# Patient Record
Sex: Female | Born: 1975
Health system: Southern US, Community
[De-identification: ages and names within clinical notes are randomized; demographics above are authoritative.]

---

## 2015-09-09 HISTORY — PX: APPENDECTOMY: SHX54

## 2017-02-27 DIAGNOSIS — H903 Sensorineural hearing loss, bilateral: Secondary | ICD-10-CM | POA: Diagnosis not present

## 2017-03-04 DIAGNOSIS — D2271 Melanocytic nevi of right lower limb, including hip: Secondary | ICD-10-CM | POA: Diagnosis not present

## 2017-03-04 DIAGNOSIS — L905 Scar conditions and fibrosis of skin: Secondary | ICD-10-CM | POA: Diagnosis not present

## 2017-03-20 DIAGNOSIS — Z1329 Encounter for screening for other suspected endocrine disorder: Secondary | ICD-10-CM | POA: Diagnosis not present

## 2017-03-20 DIAGNOSIS — Z114 Encounter for screening for human immunodeficiency virus [HIV]: Secondary | ICD-10-CM | POA: Diagnosis not present

## 2017-03-20 DIAGNOSIS — R079 Chest pain, unspecified: Secondary | ICD-10-CM | POA: Diagnosis not present

## 2017-03-20 DIAGNOSIS — R002 Palpitations: Secondary | ICD-10-CM | POA: Diagnosis not present

## 2017-03-20 DIAGNOSIS — Z0000 Encounter for general adult medical examination without abnormal findings: Secondary | ICD-10-CM | POA: Diagnosis not present

## 2017-03-20 DIAGNOSIS — R319 Hematuria, unspecified: Secondary | ICD-10-CM | POA: Diagnosis not present

## 2017-03-20 DIAGNOSIS — Z13220 Encounter for screening for lipoid disorders: Secondary | ICD-10-CM | POA: Diagnosis not present

## 2017-03-20 DIAGNOSIS — Z23 Encounter for immunization: Secondary | ICD-10-CM | POA: Diagnosis not present

## 2017-04-03 DIAGNOSIS — R319 Hematuria, unspecified: Secondary | ICD-10-CM | POA: Diagnosis not present

## 2017-05-01 DIAGNOSIS — M5412 Radiculopathy, cervical region: Secondary | ICD-10-CM | POA: Diagnosis not present

## 2017-05-24 DIAGNOSIS — M5412 Radiculopathy, cervical region: Secondary | ICD-10-CM | POA: Diagnosis not present

## 2017-05-29 DIAGNOSIS — M542 Cervicalgia: Secondary | ICD-10-CM | POA: Diagnosis not present

## 2017-05-29 DIAGNOSIS — G5602 Carpal tunnel syndrome, left upper limb: Secondary | ICD-10-CM | POA: Diagnosis not present

## 2017-05-29 DIAGNOSIS — M25512 Pain in left shoulder: Secondary | ICD-10-CM | POA: Diagnosis not present

## 2017-05-29 DIAGNOSIS — M50222 Other cervical disc displacement at C5-C6 level: Secondary | ICD-10-CM | POA: Diagnosis not present

## 2017-05-29 DIAGNOSIS — M5412 Radiculopathy, cervical region: Secondary | ICD-10-CM | POA: Diagnosis not present

## 2017-05-30 DIAGNOSIS — M5412 Radiculopathy, cervical region: Secondary | ICD-10-CM | POA: Diagnosis not present

## 2017-06-05 DIAGNOSIS — M50222 Other cervical disc displacement at C5-C6 level: Secondary | ICD-10-CM | POA: Diagnosis not present

## 2017-06-10 DIAGNOSIS — M50222 Other cervical disc displacement at C5-C6 level: Secondary | ICD-10-CM | POA: Diagnosis not present

## 2017-06-10 DIAGNOSIS — G5602 Carpal tunnel syndrome, left upper limb: Secondary | ICD-10-CM | POA: Diagnosis not present

## 2017-06-19 DIAGNOSIS — M5412 Radiculopathy, cervical region: Secondary | ICD-10-CM | POA: Diagnosis not present

## 2017-08-13 DIAGNOSIS — J4520 Mild intermittent asthma, uncomplicated: Secondary | ICD-10-CM | POA: Diagnosis not present

## 2017-08-13 DIAGNOSIS — R109 Unspecified abdominal pain: Secondary | ICD-10-CM | POA: Diagnosis not present

## 2017-08-13 DIAGNOSIS — G43909 Migraine, unspecified, not intractable, without status migrainosus: Secondary | ICD-10-CM | POA: Diagnosis not present

## 2017-08-13 DIAGNOSIS — I493 Ventricular premature depolarization: Secondary | ICD-10-CM | POA: Diagnosis not present

## 2017-08-21 DIAGNOSIS — R109 Unspecified abdominal pain: Secondary | ICD-10-CM | POA: Diagnosis not present

## 2017-10-02 DIAGNOSIS — B373 Candidiasis of vulva and vagina: Secondary | ICD-10-CM | POA: Diagnosis not present

## 2017-10-02 DIAGNOSIS — L292 Pruritus vulvae: Secondary | ICD-10-CM | POA: Diagnosis not present

## 2017-10-02 DIAGNOSIS — L299 Pruritus, unspecified: Secondary | ICD-10-CM | POA: Diagnosis not present

## 2017-10-07 ENCOUNTER — Other Ambulatory Visit: Payer: Self-pay | Admitting: Obstetrics & Gynecology

## 2017-10-07 DIAGNOSIS — Z1231 Encounter for screening mammogram for malignant neoplasm of breast: Secondary | ICD-10-CM

## 2017-10-29 ENCOUNTER — Encounter: Payer: Self-pay | Admitting: Radiology

## 2017-10-29 ENCOUNTER — Ambulatory Visit
Admission: RE | Admit: 2017-10-29 | Discharge: 2017-10-29 | Disposition: A | Discharge status: Home / Self Care | Payer: 59 | Source: Ambulatory Visit | Attending: Obstetrics & Gynecology | Admitting: Obstetrics & Gynecology

## 2017-10-29 ENCOUNTER — Encounter (INDEPENDENT_AMBULATORY_CARE_PROVIDER_SITE_OTHER): Payer: Self-pay

## 2017-10-29 DIAGNOSIS — Z1231 Encounter for screening mammogram for malignant neoplasm of breast: Secondary | ICD-10-CM | POA: Insufficient documentation

## 2017-10-29 IMAGING — MG DIGITAL SCREENING BILATERAL MAMMOGRAM WITH TOMO AND CAD
6 of 10 series · 6 of 26 positions shown · non-contrast
Comparison: Previous exam(s).

CLINICAL DATA: Screening.

EXAM:
DIGITAL SCREENING BILATERAL MAMMOGRAM WITH TOMO AND CAD

[R MLO synth-2D]
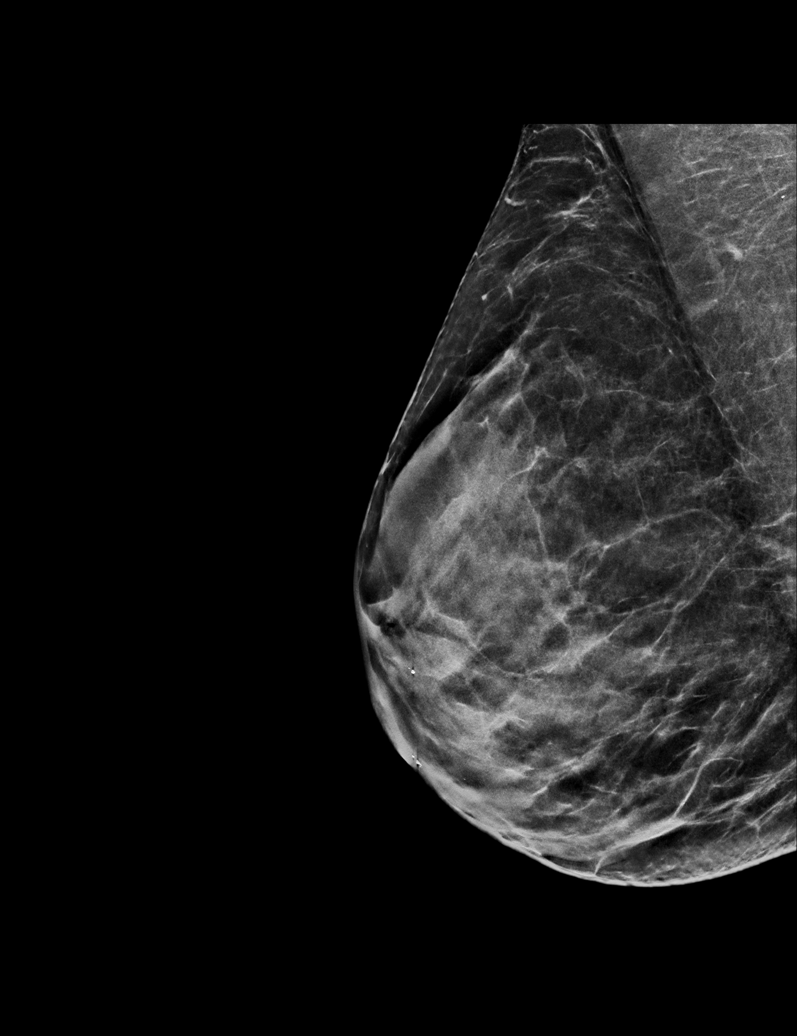

[L MLO synth-2D]
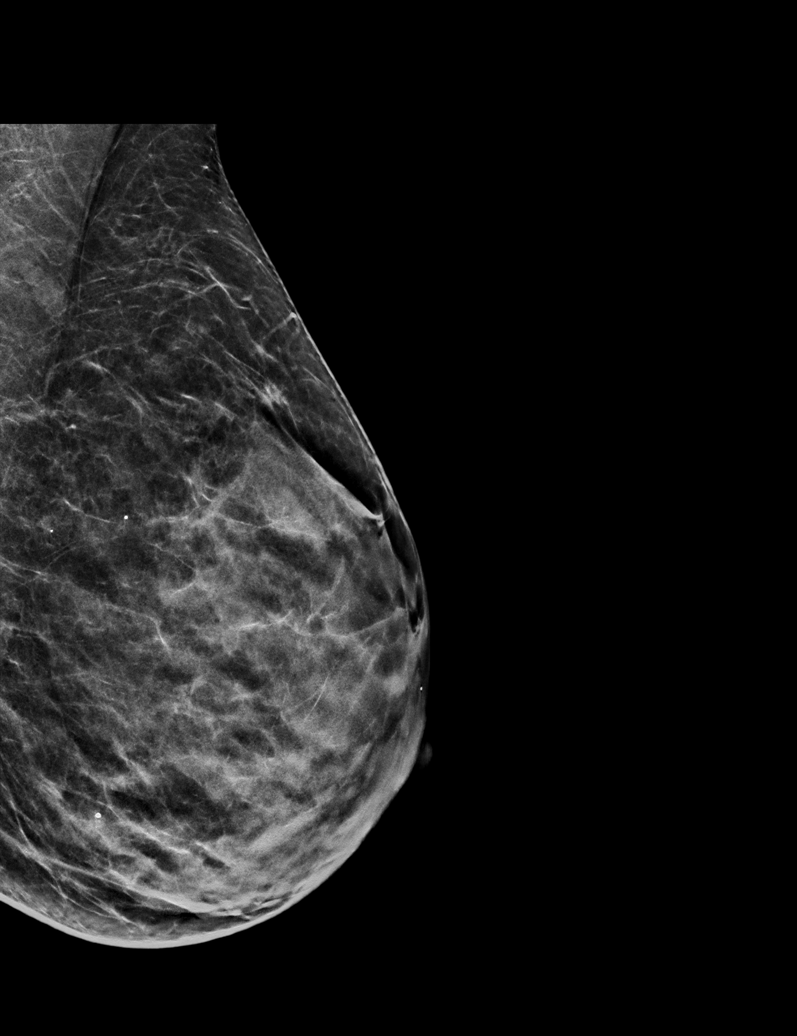

[L CC synth-2D]
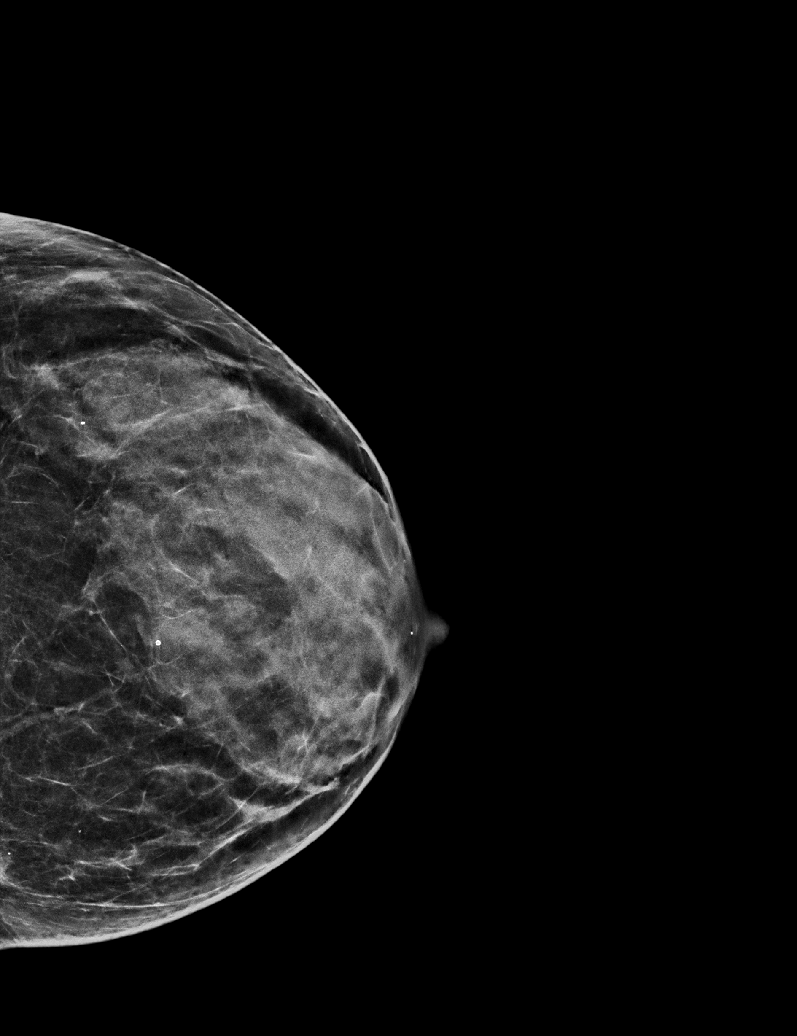

[R CC synth-2D]
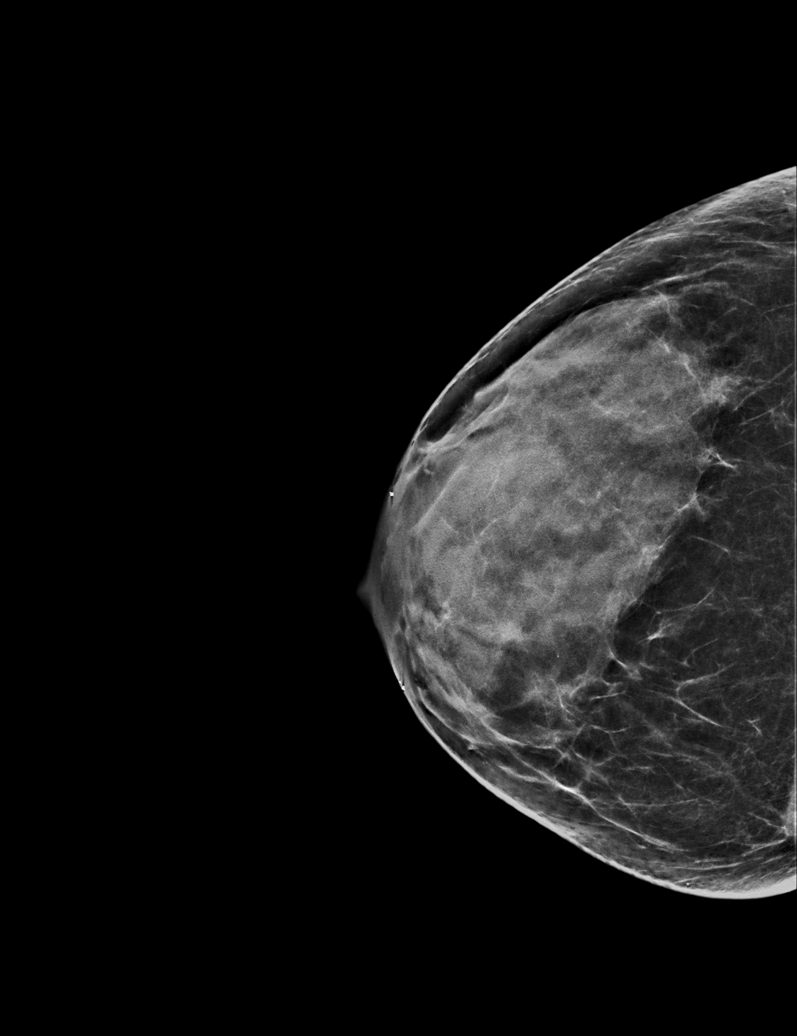

[R CC (1 of 2)]
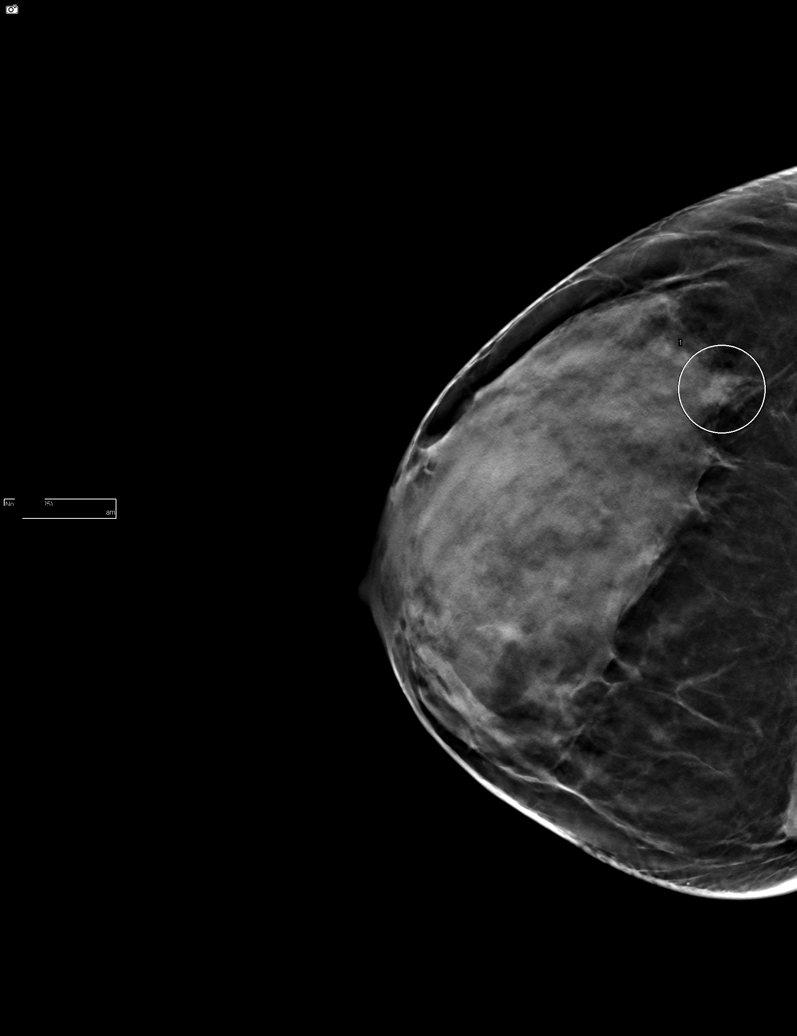

[R CC (2 of 2)]
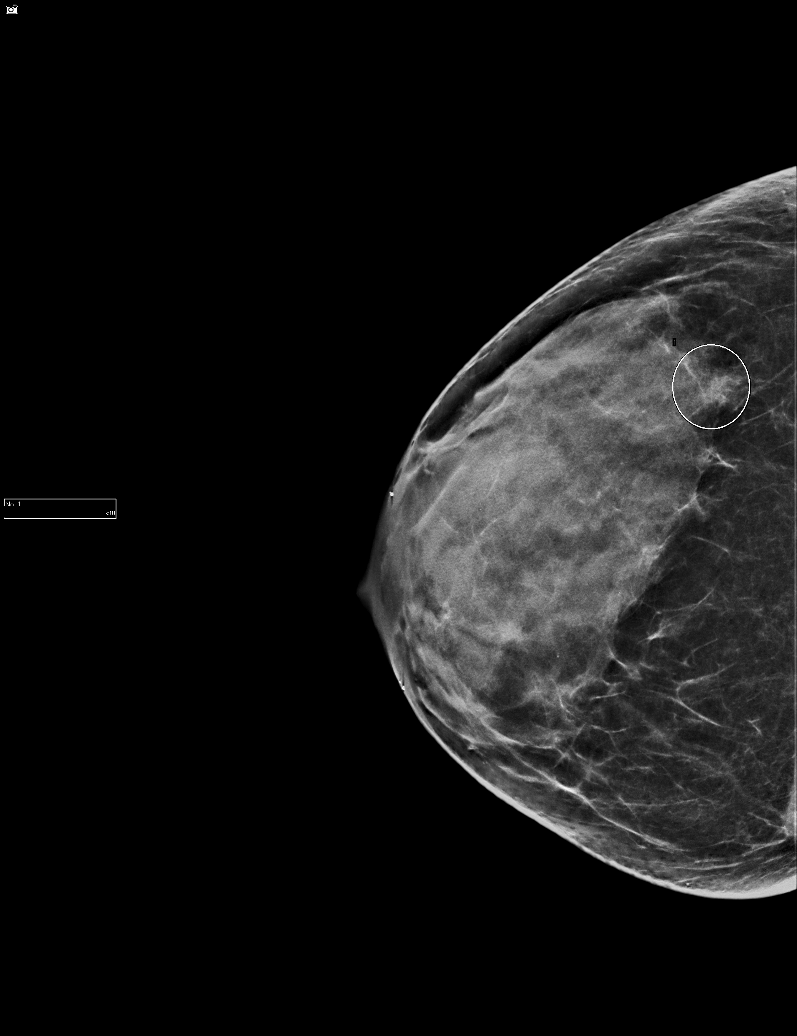

[6 of 26 positions shown; findings below may reference images not displayed]

ACR Breast Density Category d: The breast tissue is extremely dense,
which lowers the sensitivity of mammography.
FINDINGS: In the right breast, a possible asymmetry warrants further
evaluation. This possible asymmetry is seen within the outer RIGHT
breast, at posterior depth, CC view only, slice 25.

In the left breast, no findings suspicious for malignancy. Images
were processed with CAD.
IMPRESSION: Further evaluation is suggested for possible asymmetry in the right
breast.

RECOMMENDATION:
Diagnostic mammogram and possibly ultrasound of the right breast.
(Code:[A2])

The patient will be contacted regarding the findings, and additional
imaging will be scheduled.

BI-RADS CATEGORY  0: Incomplete. Need additional imaging evaluation
and/or prior mammograms for comparison.

## 2017-10-30 ENCOUNTER — Other Ambulatory Visit: Payer: Self-pay | Admitting: Obstetrics & Gynecology

## 2017-10-30 DIAGNOSIS — R928 Other abnormal and inconclusive findings on diagnostic imaging of breast: Secondary | ICD-10-CM

## 2017-10-30 DIAGNOSIS — N6489 Other specified disorders of breast: Secondary | ICD-10-CM

## 2017-11-04 ENCOUNTER — Ambulatory Visit
Admission: RE | Admit: 2017-11-04 | Discharge: 2017-11-04 | Disposition: A | Discharge status: Home / Self Care | Payer: 59 | Source: Ambulatory Visit | Attending: Obstetrics & Gynecology | Admitting: Obstetrics & Gynecology

## 2017-11-04 DIAGNOSIS — R928 Other abnormal and inconclusive findings on diagnostic imaging of breast: Secondary | ICD-10-CM | POA: Diagnosis not present

## 2017-11-04 DIAGNOSIS — N6489 Other specified disorders of breast: Secondary | ICD-10-CM | POA: Diagnosis not present

## 2017-11-04 DIAGNOSIS — R922 Inconclusive mammogram: Secondary | ICD-10-CM | POA: Diagnosis not present

## 2017-11-04 IMAGING — MG MM DIGITAL DIAGNOSTIC UNILAT*R* W/ TOMO W/ CAD
7 series · 9 of 19 positions shown · non-contrast
Comparison: Previous exam(s).

CLINICAL DATA: Right lateral breast asymmetry seen on most recent
screening mammography.

EXAM:
DIGITAL DIAGNOSTIC RIGHT MAMMOGRAM WITH CAD AND TOMO
ULTRASOUND RIGHT BREAST

[R ML synth-2D]
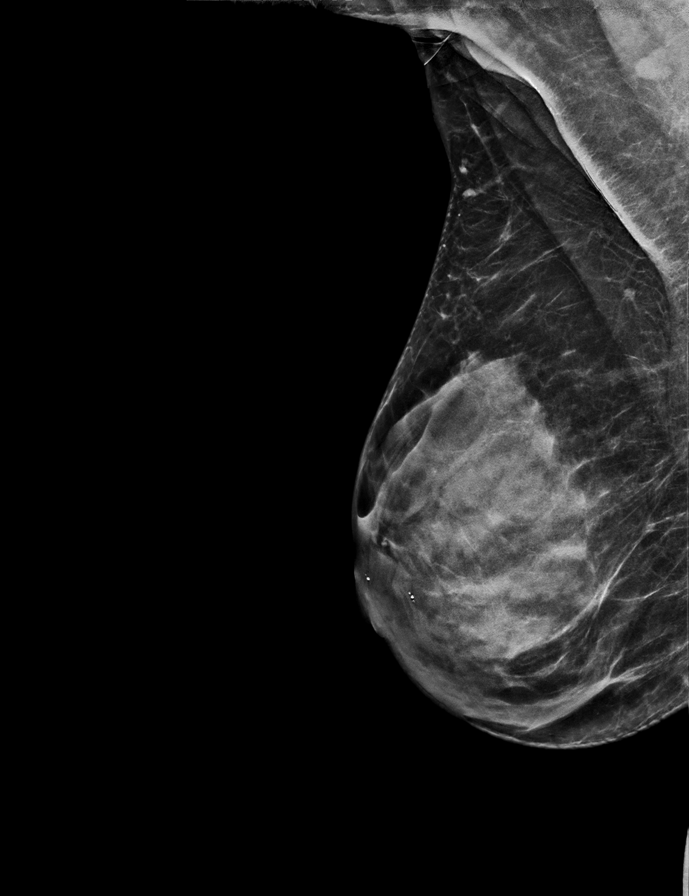

[R XCCL synth-2D]
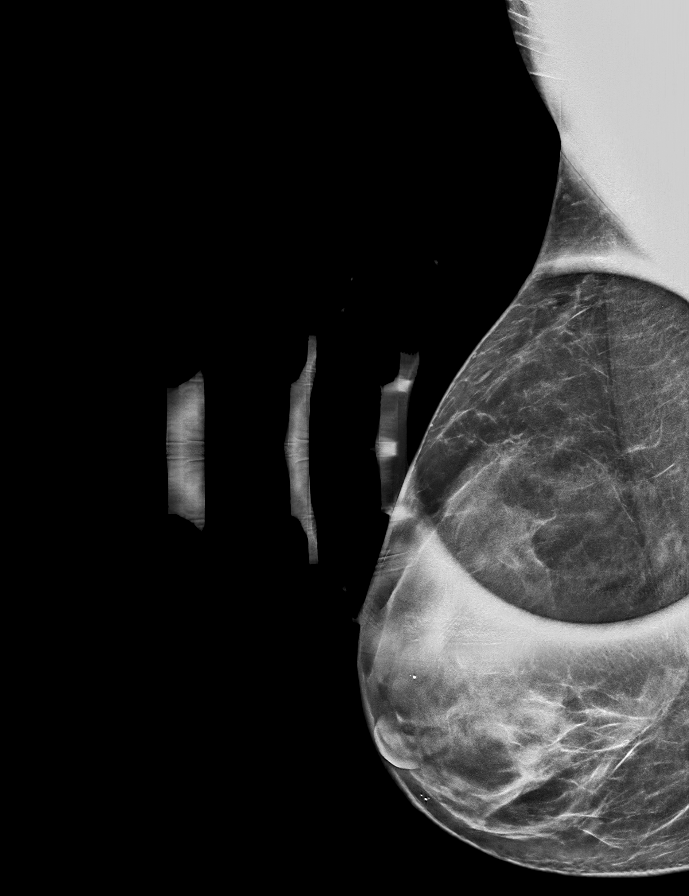

[R CC synth-2D]
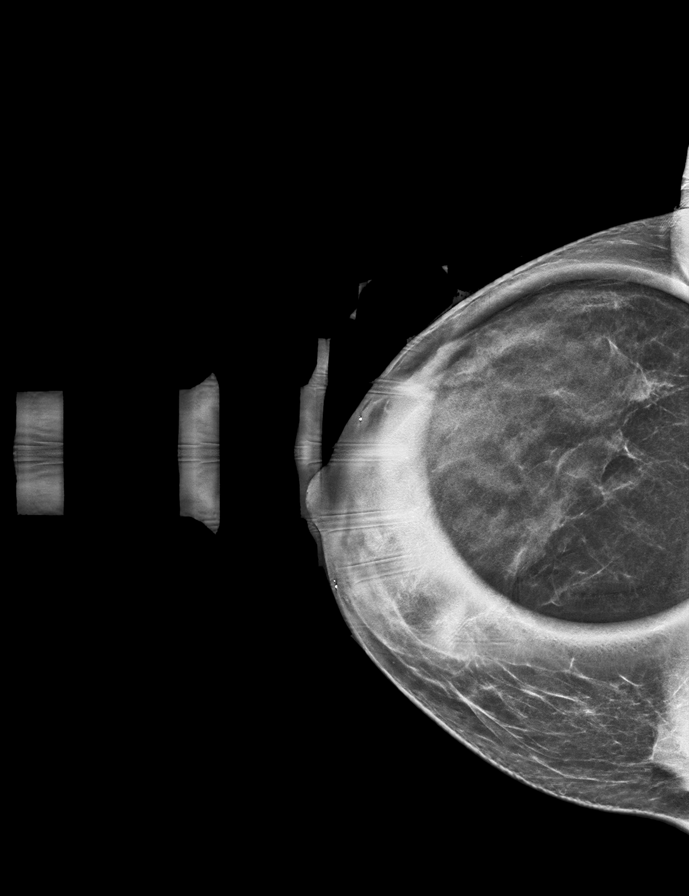

[R XCCL]
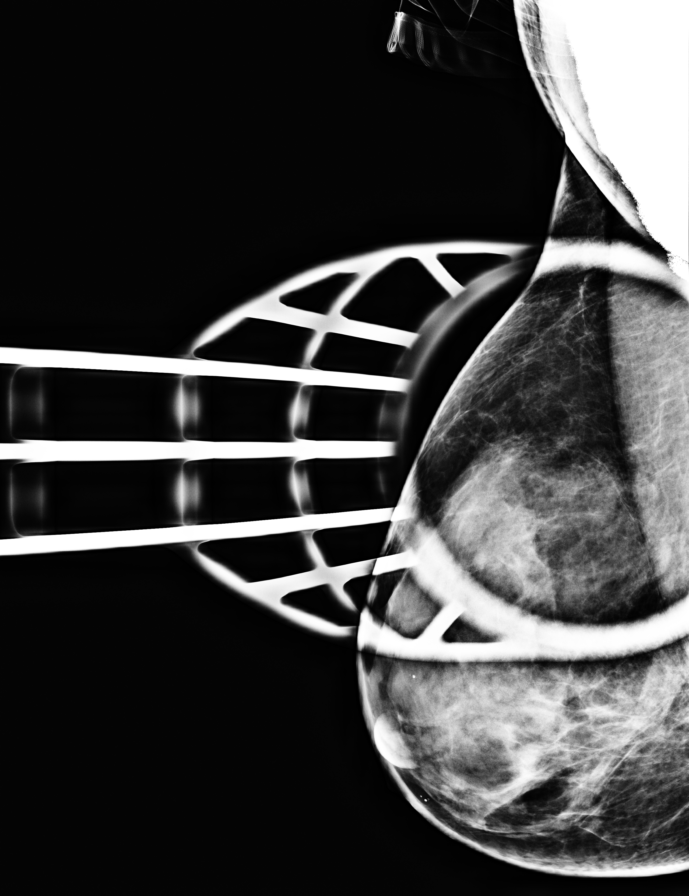

[R XCCL tomo · 3 of 60 frames shown]
[frame 20/60]
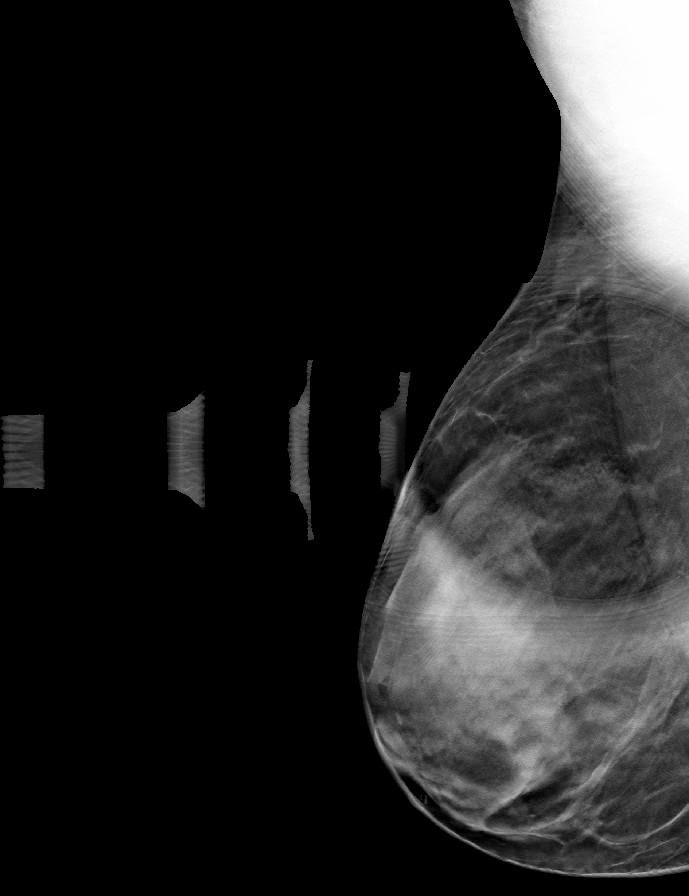
[frame 31/60]
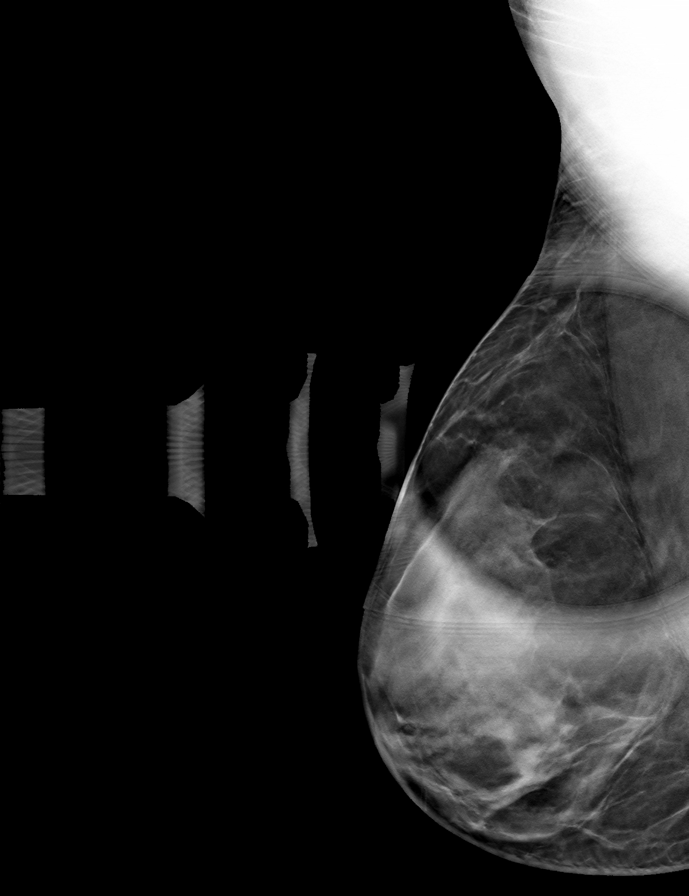
[frame 41/60]
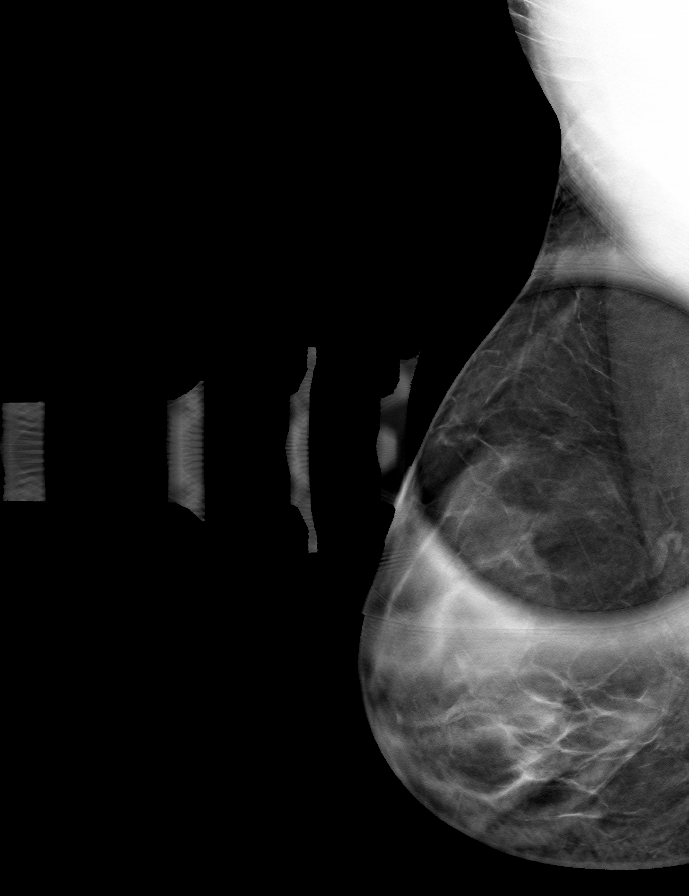

[R ML tomo · tomo slice 27/54.0]
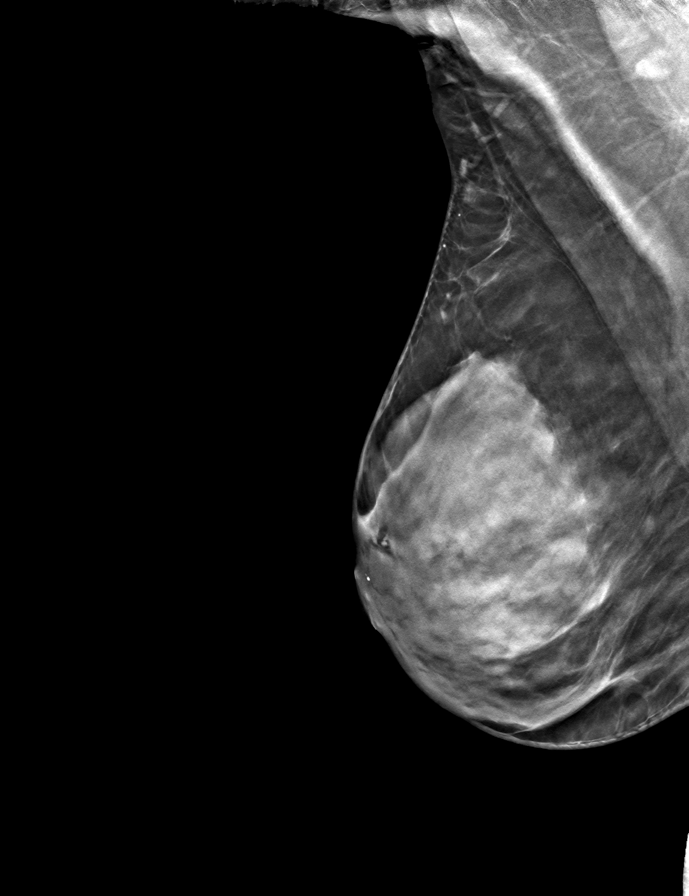

[R CC tomo · tomo slice 25/49.0]
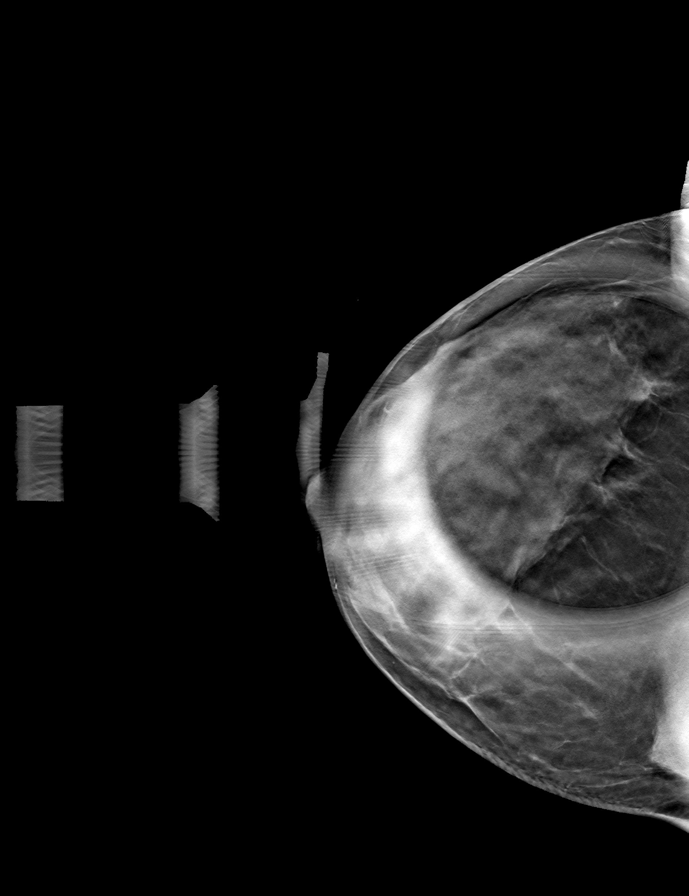

[9 of 19 positions shown; findings below may reference images not displayed]

ACR Breast Density Category d: The breast tissue is extremely dense,
which lowers the sensitivity of mammography.
FINDINGS: Additional mammographic views of the right breast demonstrate no
suspicious masses or shadowing lesions. The right lateral breast
asymmetry effaces to glandular tissue, and resumes appearance
similar to patient's oldest available mammogram, dated [DATE].

Mammographic images were processed with CAD.

On physical exam, no suspicious masses are palpated.

Targeted ultrasound is performed, showing no suspicious masses or
shadowing lesions.
IMPRESSION: No mammographic or sonographic evidence of malignancy in the right
breast.

RECOMMENDATION:
Screening mammogram in one year.(Code:[7L])

I have discussed the findings and recommendations with the patient.
Results were also provided in writing at the conclusion of the
visit. If applicable, a reminder letter will be sent to the patient
regarding the next appointment.

BI-RADS CATEGORY  1: Negative.

## 2017-11-08 ENCOUNTER — Other Ambulatory Visit: Payer: 59

## 2017-11-08 ENCOUNTER — Ambulatory Visit: Payer: 59

## 2017-11-08 DIAGNOSIS — M7062 Trochanteric bursitis, left hip: Secondary | ICD-10-CM | POA: Diagnosis not present

## 2017-11-08 DIAGNOSIS — M7061 Trochanteric bursitis, right hip: Secondary | ICD-10-CM | POA: Diagnosis not present

## 2017-11-13 DIAGNOSIS — L738 Other specified follicular disorders: Secondary | ICD-10-CM | POA: Diagnosis not present

## 2017-11-13 DIAGNOSIS — D2271 Melanocytic nevi of right lower limb, including hip: Secondary | ICD-10-CM | POA: Diagnosis not present

## 2017-11-13 DIAGNOSIS — D2272 Melanocytic nevi of left lower limb, including hip: Secondary | ICD-10-CM | POA: Diagnosis not present

## 2017-11-13 DIAGNOSIS — R5383 Other fatigue: Secondary | ICD-10-CM | POA: Diagnosis not present

## 2017-11-13 DIAGNOSIS — L659 Nonscarring hair loss, unspecified: Secondary | ICD-10-CM | POA: Diagnosis not present

## 2017-11-13 DIAGNOSIS — E559 Vitamin D deficiency, unspecified: Secondary | ICD-10-CM | POA: Diagnosis not present

## 2017-11-13 DIAGNOSIS — D225 Melanocytic nevi of trunk: Secondary | ICD-10-CM | POA: Diagnosis not present

## 2017-11-13 DIAGNOSIS — D2261 Melanocytic nevi of right upper limb, including shoulder: Secondary | ICD-10-CM | POA: Diagnosis not present

## 2017-11-13 DIAGNOSIS — L814 Other melanin hyperpigmentation: Secondary | ICD-10-CM | POA: Diagnosis not present

## 2017-11-13 DIAGNOSIS — D2262 Melanocytic nevi of left upper limb, including shoulder: Secondary | ICD-10-CM | POA: Diagnosis not present

## 2017-11-20 DIAGNOSIS — M25551 Pain in right hip: Secondary | ICD-10-CM | POA: Diagnosis not present

## 2017-11-20 DIAGNOSIS — M25552 Pain in left hip: Secondary | ICD-10-CM | POA: Diagnosis not present

## 2017-12-18 DIAGNOSIS — L659 Nonscarring hair loss, unspecified: Secondary | ICD-10-CM | POA: Diagnosis not present

## 2017-12-18 DIAGNOSIS — L650 Telogen effluvium: Secondary | ICD-10-CM | POA: Diagnosis not present

## 2017-12-18 DIAGNOSIS — E559 Vitamin D deficiency, unspecified: Secondary | ICD-10-CM | POA: Diagnosis not present

## 2017-12-18 DIAGNOSIS — S61200A Unspecified open wound of right index finger without damage to nail, initial encounter: Secondary | ICD-10-CM | POA: Diagnosis not present

## 2017-12-25 DIAGNOSIS — Z01419 Encounter for gynecological examination (general) (routine) without abnormal findings: Secondary | ICD-10-CM | POA: Diagnosis not present

## 2017-12-25 DIAGNOSIS — Z1231 Encounter for screening mammogram for malignant neoplasm of breast: Secondary | ICD-10-CM | POA: Diagnosis not present

## 2018-02-13 DIAGNOSIS — J069 Acute upper respiratory infection, unspecified: Secondary | ICD-10-CM | POA: Diagnosis not present

## 2018-02-13 DIAGNOSIS — R6889 Other general symptoms and signs: Secondary | ICD-10-CM | POA: Diagnosis not present

## 2018-02-13 DIAGNOSIS — J029 Acute pharyngitis, unspecified: Secondary | ICD-10-CM | POA: Diagnosis not present

## 2018-02-19 DIAGNOSIS — R42 Dizziness and giddiness: Secondary | ICD-10-CM | POA: Diagnosis not present

## 2018-02-25 ENCOUNTER — Telehealth: Payer: Self-pay | Admitting: Family Medicine

## 2018-02-25 MED ORDER — BUDESONIDE-FORMOTEROL FUMARATE 80-4.5 MCG/ACT IN AERO: 2.0000 | INHALATION_SPRAY | Freq: Two times a day (BID) | RESPIRATORY_TRACT | 12 refills | Status: DC

## 2018-02-25 MED ORDER — PREDNISONE 50 MG PO TABS: ORAL_TABLET | ORAL | 0 refills | Status: DC

## 2018-02-25 NOTE — Telephone Encounter (Signed)
Asthma exacerbation. Medications sent.

## 2018-03-08 DIAGNOSIS — J45909 Unspecified asthma, uncomplicated: Secondary | ICD-10-CM | POA: Diagnosis not present

## 2018-03-08 DIAGNOSIS — K0889 Other specified disorders of teeth and supporting structures: Secondary | ICD-10-CM | POA: Diagnosis not present

## 2018-03-08 DIAGNOSIS — Z79899 Other long term (current) drug therapy: Secondary | ICD-10-CM | POA: Diagnosis not present

## 2018-03-09 DIAGNOSIS — J45909 Unspecified asthma, uncomplicated: Secondary | ICD-10-CM | POA: Diagnosis not present

## 2018-03-09 DIAGNOSIS — Z9889 Other specified postprocedural states: Secondary | ICD-10-CM | POA: Diagnosis not present

## 2018-03-09 DIAGNOSIS — Z79899 Other long term (current) drug therapy: Secondary | ICD-10-CM | POA: Diagnosis not present

## 2018-03-09 DIAGNOSIS — K0889 Other specified disorders of teeth and supporting structures: Secondary | ICD-10-CM | POA: Diagnosis not present

## 2018-03-19 DIAGNOSIS — E559 Vitamin D deficiency, unspecified: Secondary | ICD-10-CM | POA: Diagnosis not present

## 2018-03-19 DIAGNOSIS — Z0000 Encounter for general adult medical examination without abnormal findings: Secondary | ICD-10-CM | POA: Diagnosis not present

## 2018-04-14 DIAGNOSIS — Z03818 Encounter for observation for suspected exposure to other biological agents ruled out: Secondary | ICD-10-CM | POA: Diagnosis not present

## 2018-04-14 DIAGNOSIS — R0602 Shortness of breath: Secondary | ICD-10-CM | POA: Diagnosis not present

## 2018-04-19 DIAGNOSIS — Z79899 Other long term (current) drug therapy: Secondary | ICD-10-CM | POA: Diagnosis not present

## 2018-04-19 DIAGNOSIS — Z975 Presence of (intrauterine) contraceptive device: Secondary | ICD-10-CM | POA: Diagnosis not present

## 2018-04-19 DIAGNOSIS — J45909 Unspecified asthma, uncomplicated: Secondary | ICD-10-CM | POA: Diagnosis not present

## 2018-04-19 DIAGNOSIS — R05 Cough: Secondary | ICD-10-CM | POA: Diagnosis not present

## 2018-04-19 DIAGNOSIS — R0602 Shortness of breath: Secondary | ICD-10-CM | POA: Diagnosis not present

## 2018-04-19 DIAGNOSIS — R0600 Dyspnea, unspecified: Secondary | ICD-10-CM | POA: Diagnosis not present

## 2018-07-08 MED FILL — SYMBICORT 80-4.5 MCG INH: 80-4.5 | 30 days supply | Qty: 10 | Fill #0

## 2018-07-15 DIAGNOSIS — R0789 Other chest pain: Secondary | ICD-10-CM | POA: Diagnosis not present

## 2018-07-15 DIAGNOSIS — F419 Anxiety disorder, unspecified: Secondary | ICD-10-CM | POA: Diagnosis not present

## 2018-08-06 DIAGNOSIS — B373 Candidiasis of vulva and vagina: Secondary | ICD-10-CM | POA: Diagnosis not present

## 2018-10-30 ENCOUNTER — Other Ambulatory Visit: Payer: Self-pay | Admitting: Obstetrics & Gynecology

## 2018-10-30 DIAGNOSIS — Z1231 Encounter for screening mammogram for malignant neoplasm of breast: Secondary | ICD-10-CM

## 2018-11-04 ENCOUNTER — Other Ambulatory Visit: Payer: Self-pay

## 2018-11-04 ENCOUNTER — Ambulatory Visit
Admission: RE | Admit: 2018-11-04 | Discharge: 2018-11-04 | Disposition: A | Discharge status: Home / Self Care | Payer: 59 | Source: Ambulatory Visit | Attending: Obstetrics & Gynecology | Admitting: Obstetrics & Gynecology

## 2018-11-04 DIAGNOSIS — Z1231 Encounter for screening mammogram for malignant neoplasm of breast: Secondary | ICD-10-CM | POA: Diagnosis not present

## 2018-11-04 IMAGING — MG DIGITAL SCREENING BILAT W/ TOMO
8 series · 9 of 24 positions shown · non-contrast
Comparison: Previous exam(s).

CLINICAL DATA: Screening.

EXAM:
DIGITAL SCREENING BILATERAL MAMMOGRAM WITH TOMO AND CAD

[R MLO synth-2D]
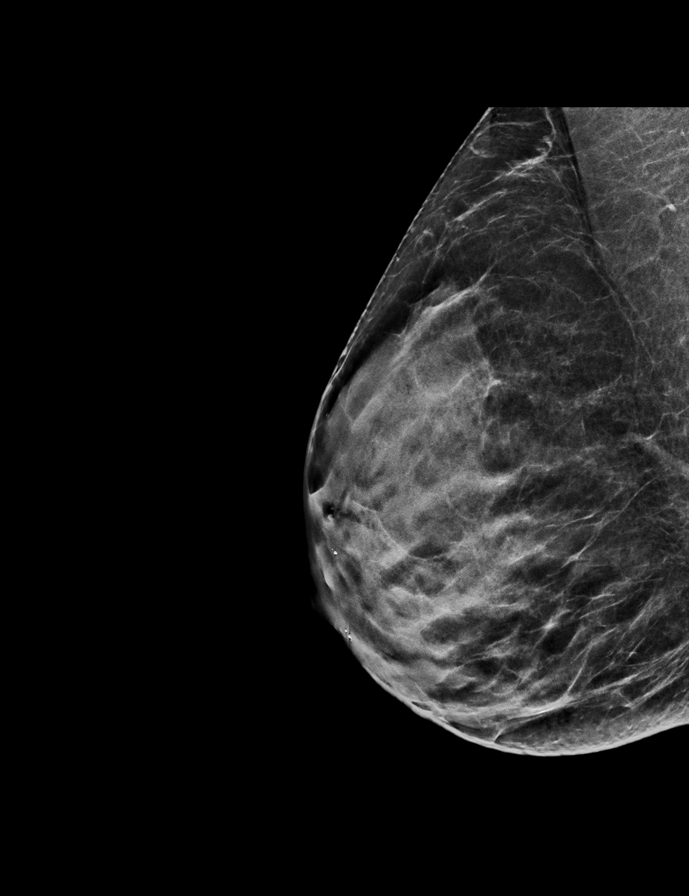

[L MLO synth-2D]
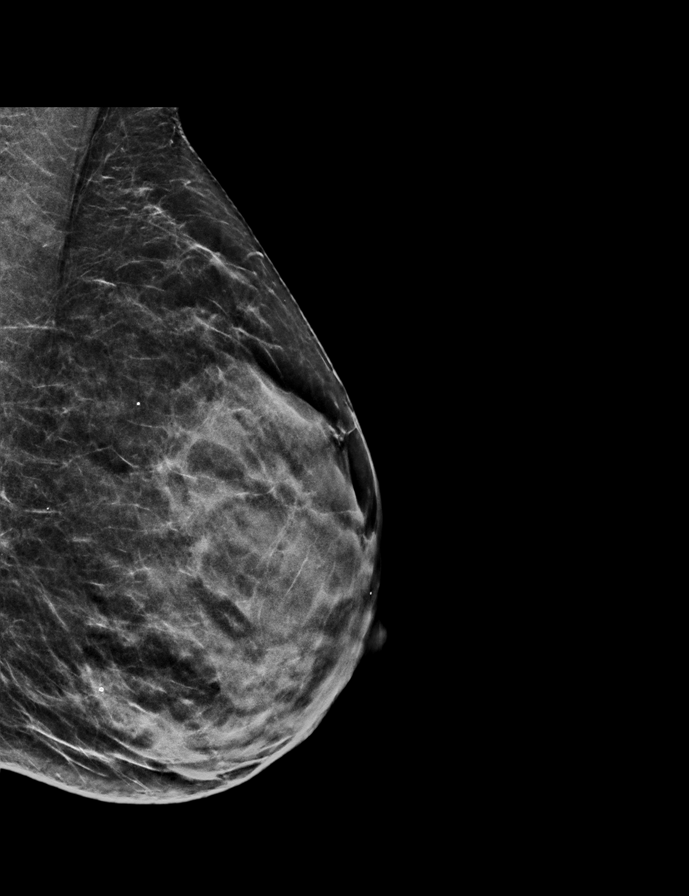

[R CC synth-2D]
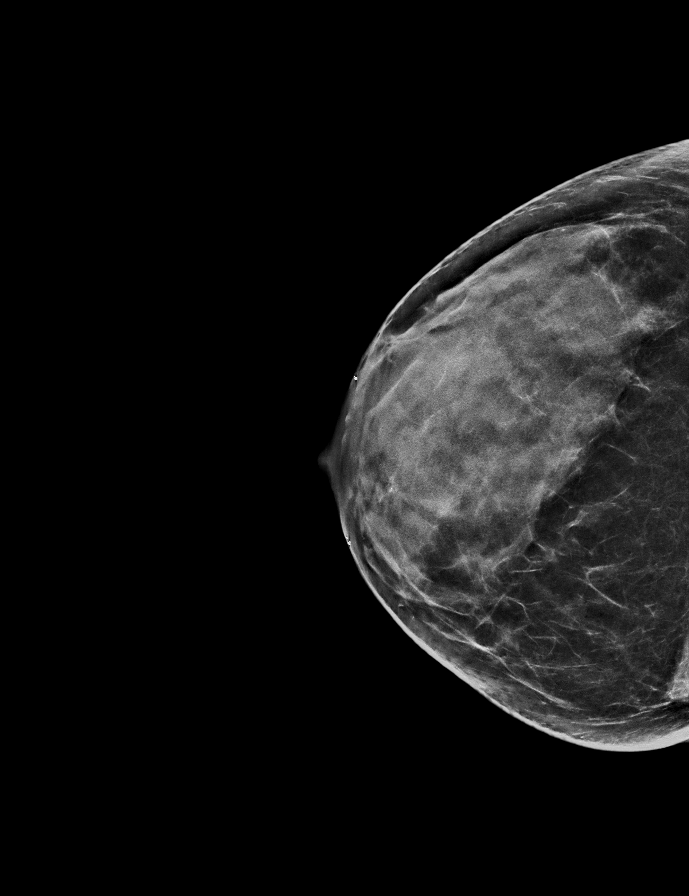

[L CC synth-2D]
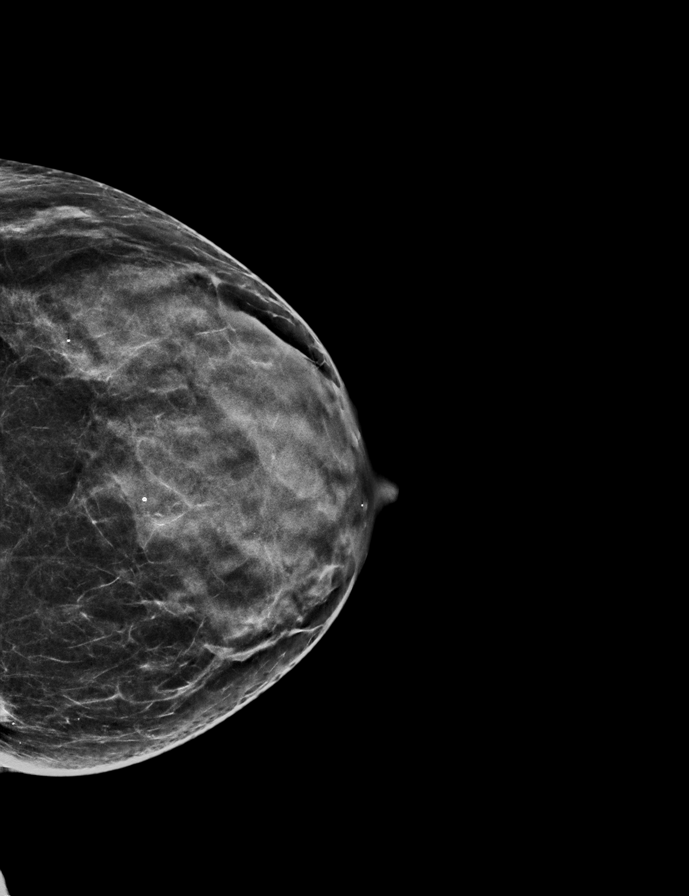

[L CC tomo · 2 of 54 frames shown]
[frame 18/54]
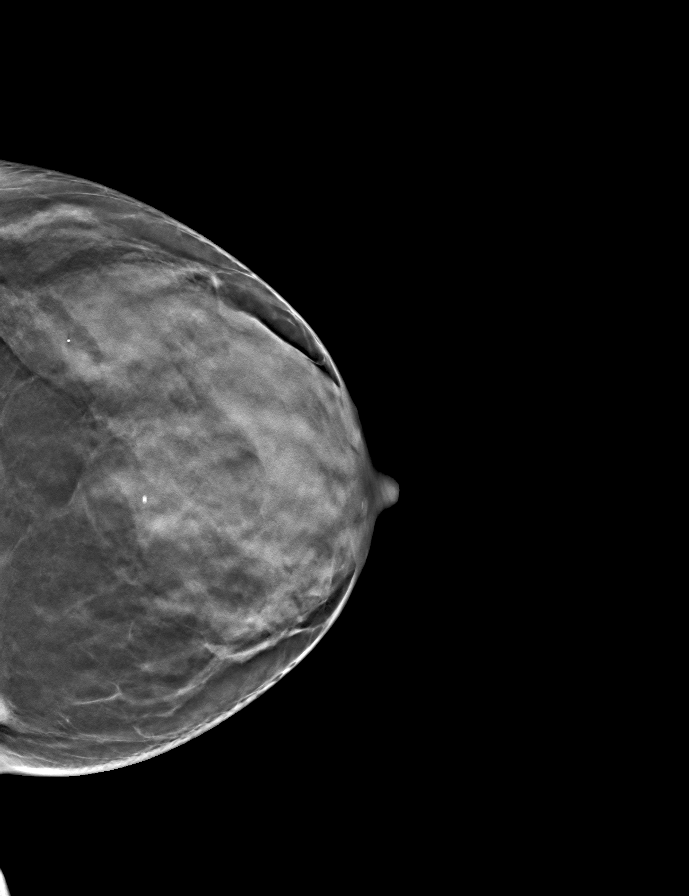
[frame 27/54]
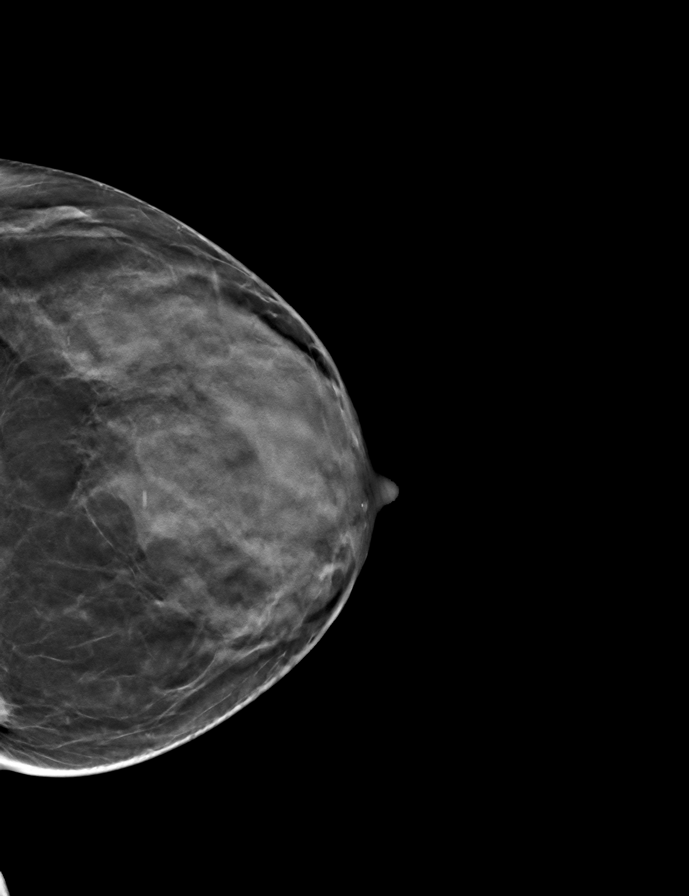

[R CC tomo · tomo slice 29/56.0]
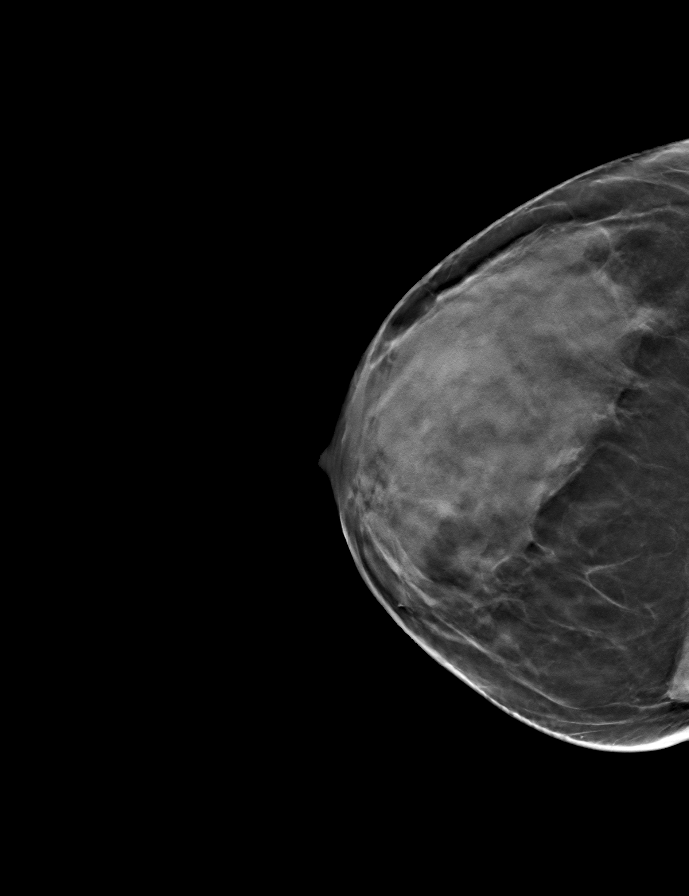

[L MLO tomo · tomo slice 26/51.0]
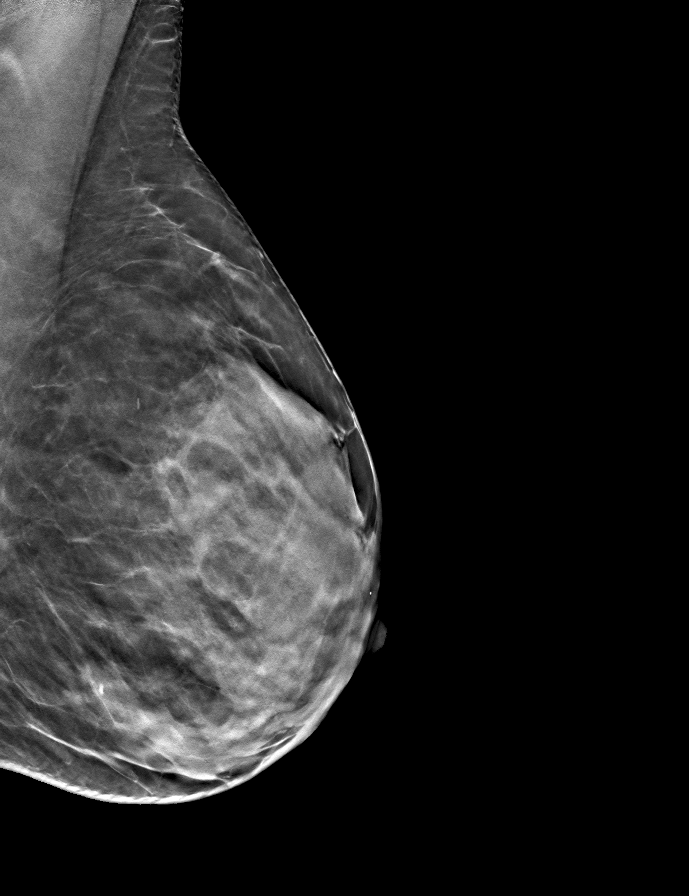

[R MLO tomo · tomo slice 27/53.0]
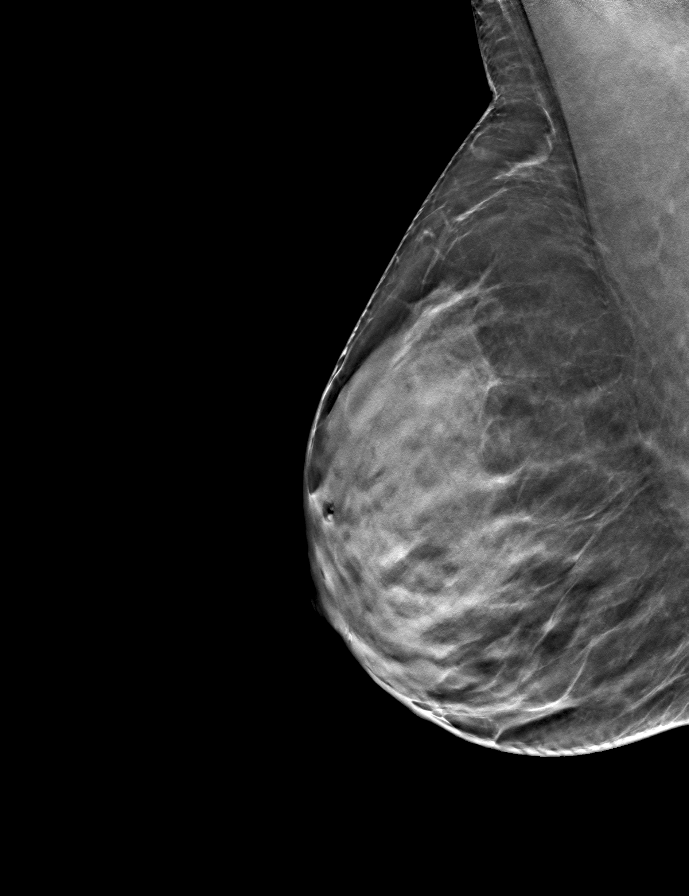

[9 of 24 positions shown; findings below may reference images not displayed]

ACR Breast Density Category c: The breast tissue is heterogeneously
dense, which may obscure small masses.
FINDINGS: There are no findings suspicious for malignancy. Images were
processed with CAD.
IMPRESSION: No mammographic evidence of malignancy. A result letter of this
screening mammogram will be mailed directly to the patient.

RECOMMENDATION:
Screening mammogram in one year. (Code:[5V])

BI-RADS CATEGORY  1: Negative.

## 2018-11-12 DIAGNOSIS — B373 Candidiasis of vulva and vagina: Secondary | ICD-10-CM | POA: Diagnosis not present

## 2018-11-12 DIAGNOSIS — L292 Pruritus vulvae: Secondary | ICD-10-CM | POA: Diagnosis not present

## 2018-11-29 DIAGNOSIS — R05 Cough: Secondary | ICD-10-CM | POA: Diagnosis not present

## 2018-11-29 DIAGNOSIS — Z20828 Contact with and (suspected) exposure to other viral communicable diseases: Secondary | ICD-10-CM | POA: Diagnosis not present

## 2018-11-29 DIAGNOSIS — Z6823 Body mass index (BMI) 23.0-23.9, adult: Secondary | ICD-10-CM | POA: Diagnosis not present

## 2018-11-29 DIAGNOSIS — J4520 Mild intermittent asthma, uncomplicated: Secondary | ICD-10-CM | POA: Diagnosis not present

## 2018-11-30 DIAGNOSIS — R0602 Shortness of breath: Secondary | ICD-10-CM | POA: Diagnosis not present

## 2018-11-30 DIAGNOSIS — J4521 Mild intermittent asthma with (acute) exacerbation: Secondary | ICD-10-CM | POA: Diagnosis not present

## 2018-12-10 DIAGNOSIS — G43909 Migraine, unspecified, not intractable, without status migrainosus: Secondary | ICD-10-CM | POA: Diagnosis not present

## 2018-12-10 DIAGNOSIS — J4540 Moderate persistent asthma, uncomplicated: Secondary | ICD-10-CM | POA: Diagnosis not present

## 2018-12-17 DIAGNOSIS — D225 Melanocytic nevi of trunk: Secondary | ICD-10-CM | POA: Diagnosis not present

## 2018-12-17 DIAGNOSIS — L738 Other specified follicular disorders: Secondary | ICD-10-CM | POA: Diagnosis not present

## 2018-12-17 DIAGNOSIS — L814 Other melanin hyperpigmentation: Secondary | ICD-10-CM | POA: Diagnosis not present

## 2018-12-17 DIAGNOSIS — L700 Acne vulgaris: Secondary | ICD-10-CM | POA: Diagnosis not present

## 2018-12-17 DIAGNOSIS — R4189 Other symptoms and signs involving cognitive functions and awareness: Secondary | ICD-10-CM | POA: Diagnosis not present

## 2018-12-17 DIAGNOSIS — D2262 Melanocytic nevi of left upper limb, including shoulder: Secondary | ICD-10-CM | POA: Diagnosis not present

## 2018-12-17 DIAGNOSIS — D2271 Melanocytic nevi of right lower limb, including hip: Secondary | ICD-10-CM | POA: Diagnosis not present

## 2018-12-17 DIAGNOSIS — L853 Xerosis cutis: Secondary | ICD-10-CM | POA: Diagnosis not present

## 2018-12-17 DIAGNOSIS — D2272 Melanocytic nevi of left lower limb, including hip: Secondary | ICD-10-CM | POA: Diagnosis not present

## 2018-12-17 DIAGNOSIS — D2261 Melanocytic nevi of right upper limb, including shoulder: Secondary | ICD-10-CM | POA: Diagnosis not present

## 2019-01-13 MED FILL — ALBUTEROL SULFATE HFA 108 (: 108 (90 BAS | 30 days supply | Qty: 18 | Fill #0

## 2019-01-21 DIAGNOSIS — L292 Pruritus vulvae: Secondary | ICD-10-CM | POA: Diagnosis not present

## 2019-01-21 DIAGNOSIS — Z01419 Encounter for gynecological examination (general) (routine) without abnormal findings: Secondary | ICD-10-CM | POA: Diagnosis not present

## 2019-01-21 DIAGNOSIS — N952 Postmenopausal atrophic vaginitis: Secondary | ICD-10-CM | POA: Diagnosis not present

## 2019-01-21 DIAGNOSIS — R232 Flushing: Secondary | ICD-10-CM | POA: Diagnosis not present

## 2019-01-28 DIAGNOSIS — R413 Other amnesia: Secondary | ICD-10-CM | POA: Diagnosis not present

## 2019-01-28 DIAGNOSIS — H903 Sensorineural hearing loss, bilateral: Secondary | ICD-10-CM | POA: Diagnosis not present

## 2019-02-10 DIAGNOSIS — R0602 Shortness of breath: Secondary | ICD-10-CM | POA: Diagnosis not present

## 2019-02-10 DIAGNOSIS — J4520 Mild intermittent asthma, uncomplicated: Secondary | ICD-10-CM | POA: Diagnosis not present

## 2019-02-10 DIAGNOSIS — R064 Hyperventilation: Secondary | ICD-10-CM | POA: Diagnosis not present

## 2019-02-10 DIAGNOSIS — Z8709 Personal history of other diseases of the respiratory system: Secondary | ICD-10-CM | POA: Diagnosis not present

## 2019-03-05 DIAGNOSIS — H903 Sensorineural hearing loss, bilateral: Secondary | ICD-10-CM | POA: Diagnosis not present

## 2019-04-29 DIAGNOSIS — R509 Fever, unspecified: Secondary | ICD-10-CM | POA: Diagnosis not present

## 2019-04-29 DIAGNOSIS — Z1159 Encounter for screening for other viral diseases: Secondary | ICD-10-CM | POA: Diagnosis not present

## 2019-04-29 DIAGNOSIS — E559 Vitamin D deficiency, unspecified: Secondary | ICD-10-CM | POA: Diagnosis not present

## 2019-04-29 DIAGNOSIS — R194 Change in bowel habit: Secondary | ICD-10-CM | POA: Diagnosis not present

## 2019-04-29 DIAGNOSIS — Z13220 Encounter for screening for lipoid disorders: Secondary | ICD-10-CM | POA: Diagnosis not present

## 2019-04-29 DIAGNOSIS — Z0000 Encounter for general adult medical examination without abnormal findings: Secondary | ICD-10-CM | POA: Diagnosis not present

## 2019-05-01 ENCOUNTER — Ambulatory Visit
Admission: EM | Admit: 2019-05-01 | Discharge: 2019-05-01 | Disposition: A | Discharge status: Home / Self Care | Payer: 59 | Attending: Family Medicine | Admitting: Family Medicine

## 2019-05-01 ENCOUNTER — Other Ambulatory Visit: Payer: Self-pay

## 2019-05-01 ENCOUNTER — Encounter: Payer: Self-pay | Admitting: Emergency Medicine

## 2019-05-01 DIAGNOSIS — Z20822 Contact with and (suspected) exposure to covid-19: Secondary | ICD-10-CM | POA: Diagnosis not present

## 2019-05-01 LAB — SARS CORONAVIRUS 2 (TAT 6-24 HRS): SARS Coronavirus 2: NEGATIVE

## 2019-05-01 NOTE — ED Provider Notes (Signed)
[facility]    CSN: [id number] Arrival date & time: 05/01/19  1050      History   Chief Complaint Chief Complaint  Patient presents with  . COVID Exposure    no symptoms   HPI  44 year old female presents with Covid exposure.  Patient is an ultrasound tech.  She recently did an ultrasound and the patient who tested positive for Covid.  She and the patient were wearing her mask.  She is going to be traveling this weekend.  She would like to be tested for Covid.  No symptoms at this time.  No other complaints.  Home Medications    Prior to Admission medications   Medication Sig Start Date End Date Taking? Authorizing Provider  albuterol (VENTOLIN HFA) 108 (90 Base) MCG/ACT inhaler Inhale into the lungs. 09/09/13  Yes [provider]  budesonide-formoterol (SYMBICORT) 80-4.5 MCG/ACT inhaler Inhale 2 puffs into the lungs 2 (two) times daily. 02/25/18  Yes Lilit Cinelli G, DO  butalbital-acetaminophen-caffeine (FIORICET) 50-325-40 MG tablet Take by mouth. 12/10/18  Yes [provider]  levonorgestrel (MIRENA) 20 MCG/24HR IUD by Intrauterine route.   Yes [provider]  SUMAtriptan (IMITREX) 25 MG tablet  12/10/18  Yes [provider]  predniSONE (DELTASONE) 50 MG tablet 1 tablet daily x 5 days 02/25/18   Coral Spikes, DO    Family History Family History  Problem Relation Age of Onset  . Breast cancer Neg Hx     Social History Social History   Tobacco Use  . Smoking status: Never Smoker  . Smokeless tobacco: Never Used  Substance Use Topics  . Alcohol use: Not Currently  . Drug use: Never     Allergies   Patient has no known allergies.   Review of Systems Review of Systems  Constitutional: Negative.   HENT: Negative.   Respiratory: Negative.    Physical Exam Triage Vital Signs ED Triage Vitals  Enc Vitals Group     BP 05/01/19 1106 124/90     Pulse Rate 05/01/19 1106 96     Resp 05/01/19 1106 14     Temp 05/01/19  1106 98.4 F (36.9 C)     Temp Source 05/01/19 1106 Oral     SpO2 05/01/19 1106 100 %     Weight 05/01/19 1103 130 lb (59 kg)     Height 05/01/19 1103 5\' 2"  (1.575 m)     Head Circumference --      Peak Flow --      Pain Score 05/01/19 1102 0     Pain Loc --      Pain Edu? --      Excl. in [place]? --    Updated Vital Signs BP 124/90 (BP Location: Right Arm)   Pulse 96   Temp 98.4 F (36.9 C) (Oral)   Resp 14   Ht 5\' 2"  (1.575 m)   Wt 59 kg   SpO2 100%   BMI 23.78 kg/m   Visual Acuity Right Eye Distance:   Left Eye Distance:   Bilateral Distance:    Right Eye Near:   Left Eye Near:    Bilateral Near:     Physical Exam Vitals and nursing note reviewed.  Constitutional:      General: She is not in acute distress.    Appearance: Normal appearance. She is not ill-appearing.  HENT:     Head: Normocephalic and atraumatic.  Eyes:     General:  Right eye: No discharge.        Left eye: No discharge.     Conjunctiva/sclera: Conjunctivae normal.  Cardiovascular:     Rate and Rhythm: Normal rate and regular rhythm.     Heart sounds: No murmur.  Pulmonary:     Effort: Pulmonary effort is normal.     Breath sounds: Normal breath sounds. No wheezing, rhonchi or rales.  Neurological:     Mental Status: She is alert.  Psychiatric:        Mood and Affect: Mood normal.        Behavior: Behavior normal.    UC Treatments / Results  Labs (all labs ordered are listed, but only abnormal results are displayed) Labs Reviewed  SARS CORONAVIRUS 2 (TAT 6-24 HRS)    EKG   Radiology No results found.  Procedures Procedures (including critical care time)  Medications Ordered in UC Medications - No data to display  Initial Impression / Assessment and Plan / UC Course  I have reviewed the triage vital signs and the nursing notes.  Pertinent labs & imaging results that were available during my care of the patient were reviewed by me and considered in my medical  decision making (see chart for details).    44 year old female presents for Covid testing.  Asymptomatic.  Awaiting test results.  Anticipate negative result.  Final Clinical Impressions(s) / UC Diagnoses   Final diagnoses:  Exposure to COVID-19 virus   Discharge Instructions   None    ED Prescriptions    None     PDMP not reviewed this encounter.   Coral Spikes, [place] 05/01/19 1236

## 2019-05-01 NOTE — ED Triage Notes (Signed)
Patient states that she was exposed to a patient that was positive for COVID on Tuesday.  Patient states that she was wearing a mask during the visit.  Patient states that she was in the room a hour with the patient.  Patient denies any symptoms.  Patient would like a COVID test since she will be traveling this weekend.  Patient states that she has been vaccinated for COVID.

## 2019-05-13 DIAGNOSIS — H903 Sensorineural hearing loss, bilateral: Secondary | ICD-10-CM | POA: Diagnosis not present

## 2019-05-13 DIAGNOSIS — H6123 Impacted cerumen, bilateral: Secondary | ICD-10-CM | POA: Diagnosis not present

## 2019-05-21 DIAGNOSIS — R0602 Shortness of breath: Secondary | ICD-10-CM | POA: Diagnosis not present

## 2019-05-21 DIAGNOSIS — J4520 Mild intermittent asthma, uncomplicated: Secondary | ICD-10-CM | POA: Diagnosis not present

## 2019-07-15 DIAGNOSIS — M5412 Radiculopathy, cervical region: Secondary | ICD-10-CM | POA: Diagnosis not present

## 2019-09-21 MED FILL — SYMBICORT 80-4.5 MCG INH: 80-4.5 | 30 days supply | Qty: 10 | Fill #0

## 2019-09-22 ENCOUNTER — Other Ambulatory Visit: Payer: Self-pay

## 2019-09-22 ENCOUNTER — Ambulatory Visit
Admission: EM | Admit: 2019-09-22 | Discharge: 2019-09-22 | Disposition: A | Discharge status: Home / Self Care | Payer: 59 | Attending: Family Medicine | Admitting: Family Medicine

## 2019-09-22 DIAGNOSIS — Z20822 Contact with and (suspected) exposure to covid-19: Secondary | ICD-10-CM | POA: Insufficient documentation

## 2019-09-22 DIAGNOSIS — J069 Acute upper respiratory infection, unspecified: Secondary | ICD-10-CM | POA: Insufficient documentation

## 2019-09-22 LAB — RAPID INFLUENZA A&B ANTIGENS
Influenza A (ARMC): NEGATIVE
Influenza B (ARMC): NEGATIVE

## 2019-09-22 NOTE — ED Triage Notes (Signed)
Patient in today w/ c/o cough, sinus congestion and drainage, sore throat.

## 2019-09-22 NOTE — ED Provider Notes (Signed)
[facility]    CSN: [id number] Arrival date & time: 09/22/19  1404      History   Chief Complaint Chief Complaint  Patient presents with  . Cough  . Sore Throat  . Nasal Congestion   HPI  44 year old female presents with the above complaints.  Patient reports her symptoms started today.  She reports cough, congestion, sneezing, postnasal drip, sore throat.  No fever.  One of her sons has recently tested positive for influenza.  Patient states that she went to work and was encouraged by her coworkers to contact health at work.  Health at work advised Covid test.  No relieving factors.  No other associated symptoms.  No other complaints.  Home Medications    Prior to Admission medications   Medication Sig Start Date End Date Taking? Authorizing Provider  albuterol (VENTOLIN HFA) 108 (90 Base) MCG/ACT inhaler Inhale into the lungs. 09/09/13  Yes [provider]  budesonide-formoterol (SYMBICORT) 80-4.5 MCG/ACT inhaler Inhale 2 puffs into the lungs 2 (two) times daily. 02/25/18  Yes Yolandra Habig G, DO  butalbital-acetaminophen-caffeine (FIORICET) 50-325-40 MG tablet Take by mouth. 12/10/18  Yes [provider]  levonorgestrel (MIRENA) 20 MCG/24HR IUD by Intrauterine route.   Yes [provider]  SUMAtriptan (IMITREX) 25 MG tablet  12/10/18  Yes [provider]    Family History Family History  Problem Relation Age of Onset  . Breast cancer Neg Hx     Social History Social History   Tobacco Use  . Smoking status: Never Smoker  . Smokeless tobacco: Never Used  Vaping Use  . Vaping Use: Never used  Substance Use Topics  . Alcohol use: Not Currently  . Drug use: Never     Allergies   Patient has no known allergies.   Review of Systems Review of Systems Per HPI  Physical Exam Triage Vital Signs ED Triage Vitals  Enc Vitals Group     BP 09/22/19 1553 (!) 114/91     Pulse Rate 09/22/19 1553 (!) 114     Resp 09/22/19  1553 18     Temp 09/22/19 1553 98.8 F (37.1 C)     Temp Source 09/22/19 1553 Oral     SpO2 09/22/19 1553 100 %     Weight 09/22/19 1554 130 lb (59 kg)     Height 09/22/19 1554 5\' 2"  (1.575 m)     Head Circumference --      Peak Flow --      Pain Score 09/22/19 1551 0     Pain Loc --      Pain Edu? --      Excl. in [place]? --    Updated Vital Signs BP (!) 114/91 (BP Location: Left Arm)   Pulse (!) 114   Temp 98.8 F (37.1 C) (Oral)   Resp 18   Ht 5\' 2"  (1.575 m)   Wt 59 kg   SpO2 100%   BMI 23.78 kg/m   Visual Acuity Right Eye Distance:   Left Eye Distance:   Bilateral Distance:    Right Eye Near:   Left Eye Near:    Bilateral Near:     Physical Exam Vitals and nursing note reviewed.  Constitutional:      General: She is not in acute distress.    Appearance: Normal appearance. She is not ill-appearing.  HENT:     Head: Normocephalic and atraumatic.     Mouth/Throat:     Pharynx: Oropharynx is clear.  No oropharyngeal exudate.  Eyes:     General:        Right eye: No discharge.        Left eye: No discharge.     Conjunctiva/sclera: Conjunctivae normal.  Cardiovascular:     Rate and Rhythm: Regular rhythm. Tachycardia present.  Pulmonary:     Effort: Pulmonary effort is normal.     Breath sounds: Normal breath sounds. No wheezing, rhonchi or rales.  Neurological:     Mental Status: She is alert.  Psychiatric:        Mood and Affect: Mood normal.        Behavior: Behavior normal.    UC Treatments / Results  Labs (all labs ordered are listed, but only abnormal results are displayed) Labs Reviewed  RAPID INFLUENZA A&B ANTIGENS  SARS CORONAVIRUS 2 (TAT 6-24 HRS)    EKG   Radiology No results found.  Procedures Procedures (including critical care time)  Medications Ordered in UC Medications - No data to display  Initial Impression / Assessment and Plan / UC Course  I have reviewed the triage vital signs and the nursing notes.  Pertinent labs &  imaging results that were available during my care of the patient were reviewed by me and considered in my medical decision making (see chart for details).    44 year old female presents with a viral URI.  Possible COVID-19.  Awaiting test results.  Advised over-the-counter Flonase and Zyrtec-D.  Supportive care.  Final Clinical Impressions(s) / UC Diagnoses   Final diagnoses:  Viral upper respiratory tract infection  Encounter for laboratory testing for COVID-19 virus     Discharge Instructions     OTC Flonase and Zyrtec D.  Test result will be back tomorrow.  Take care  Dr. Lacinda Axon    ED Prescriptions    None     PDMP not reviewed this encounter.   Coral Spikes, DO 09/22/19 1640

## 2019-09-22 NOTE — Discharge Instructions (Signed)
OTC Flonase and Zyrtec D.  Test result will be back tomorrow.  Take care  Dr. Lacinda Axon

## 2019-09-23 ENCOUNTER — Other Ambulatory Visit: Payer: Self-pay | Admitting: Internal Medicine

## 2019-09-23 DIAGNOSIS — Z1231 Encounter for screening mammogram for malignant neoplasm of breast: Secondary | ICD-10-CM

## 2019-09-23 LAB — SARS CORONAVIRUS 2 (TAT 6-24 HRS): SARS Coronavirus 2: NEGATIVE

## 2019-10-15 ENCOUNTER — Other Ambulatory Visit: Payer: Self-pay

## 2019-10-15 ENCOUNTER — Ambulatory Visit
Admission: RE | Admit: 2019-10-15 | Discharge: 2019-10-15 | Disposition: A | Discharge status: Home / Self Care | Payer: 59 | Source: Ambulatory Visit | Attending: Obstetrics and Gynecology | Admitting: Obstetrics and Gynecology

## 2019-10-15 DIAGNOSIS — Z1231 Encounter for screening mammogram for malignant neoplasm of breast: Secondary | ICD-10-CM | POA: Insufficient documentation

## 2019-10-15 IMAGING — MG DIGITAL SCREENING BILAT W/ TOMO W/ CAD
8 series · 9 of 24 positions shown · non-contrast
Comparison: Previous exam(s).

CLINICAL DATA: Screening.

EXAM:
DIGITAL SCREENING BILATERAL MAMMOGRAM WITH TOMO AND CAD

[L CC synth-2D]
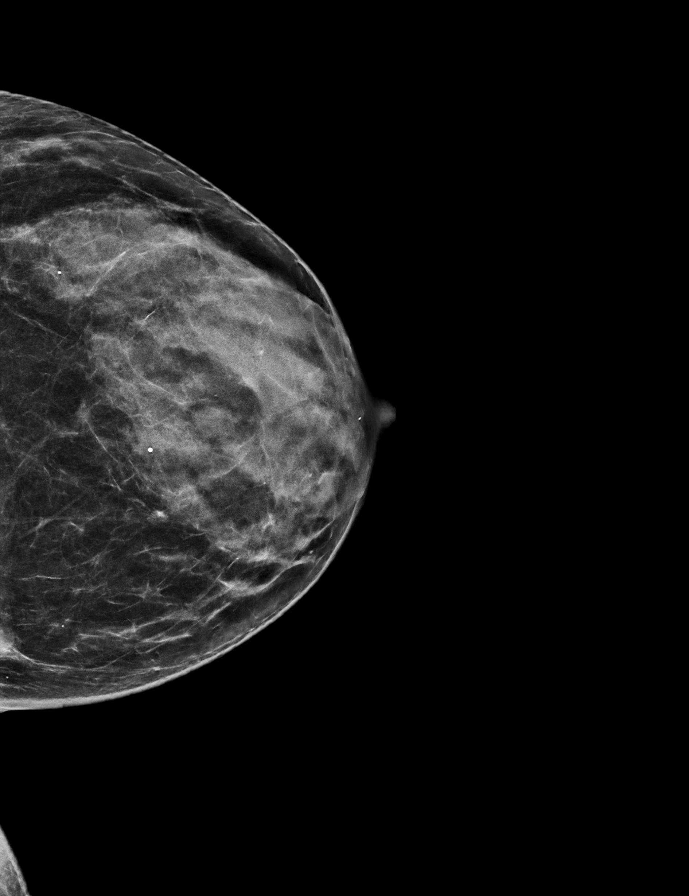

[R CC synth-2D]
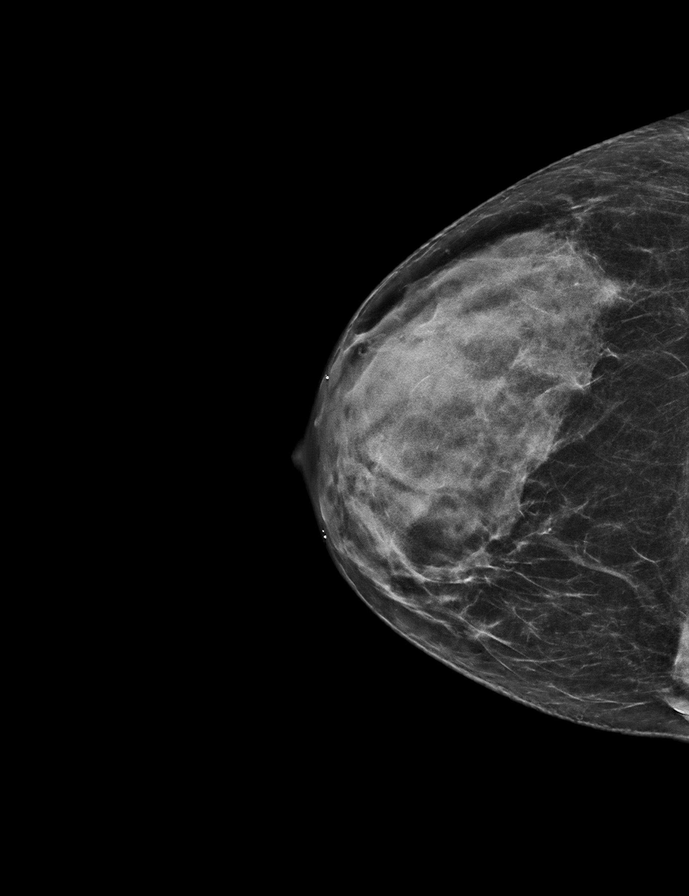

[L MLO synth-2D]
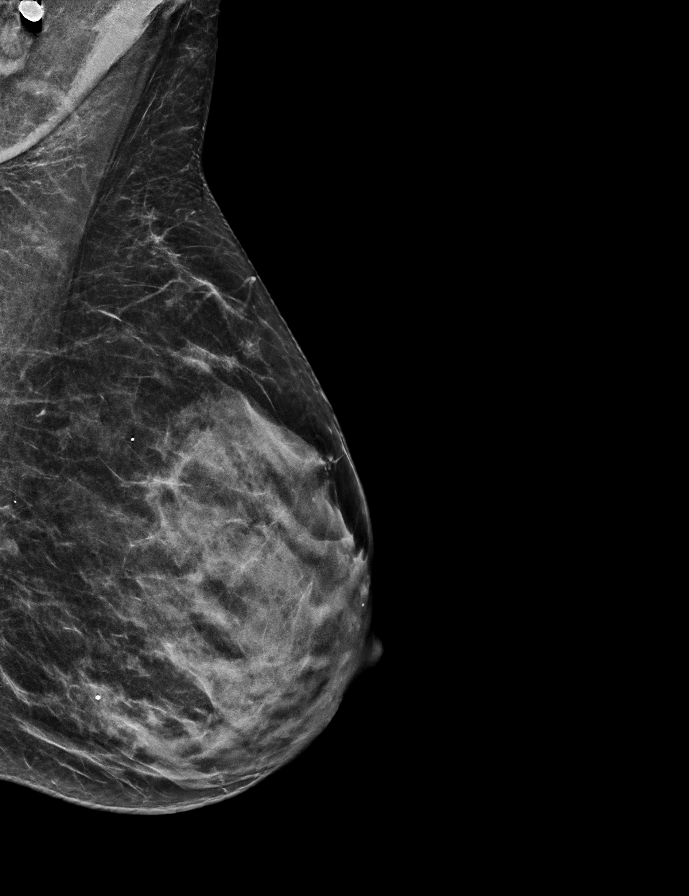

[R MLO synth-2D]
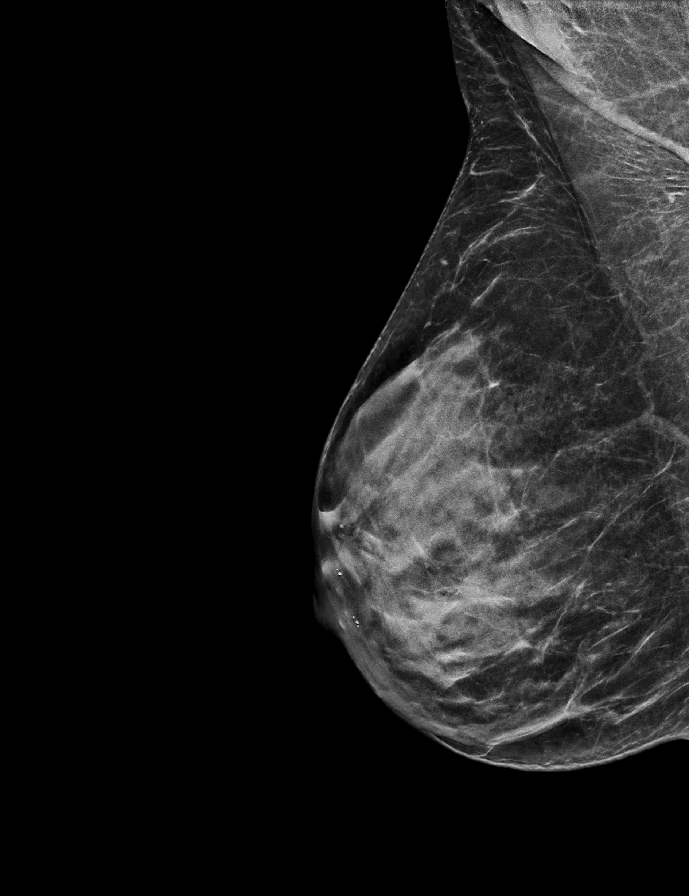

[R MLO tomo · 2 of 52 frames shown]
[frame 17/52]
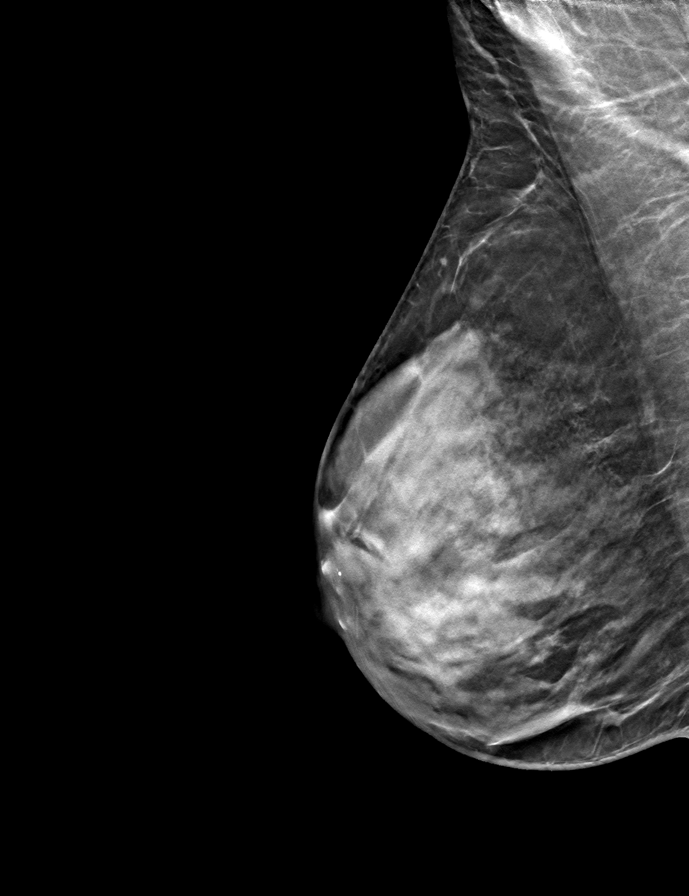
[frame 27/52]
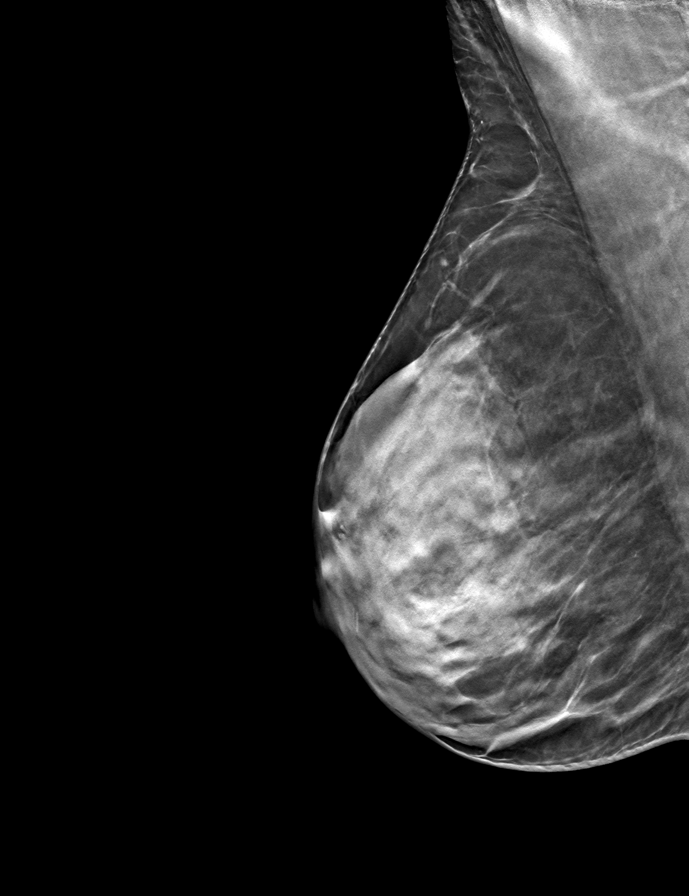

[L CC tomo · tomo slice 26/51.0]
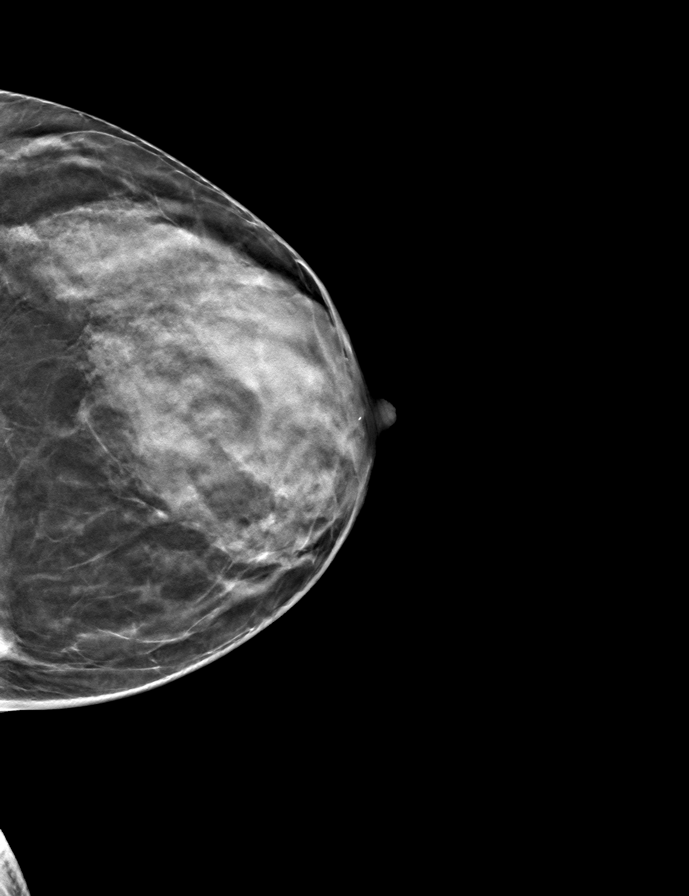

[R CC tomo · tomo slice 27/52.0]
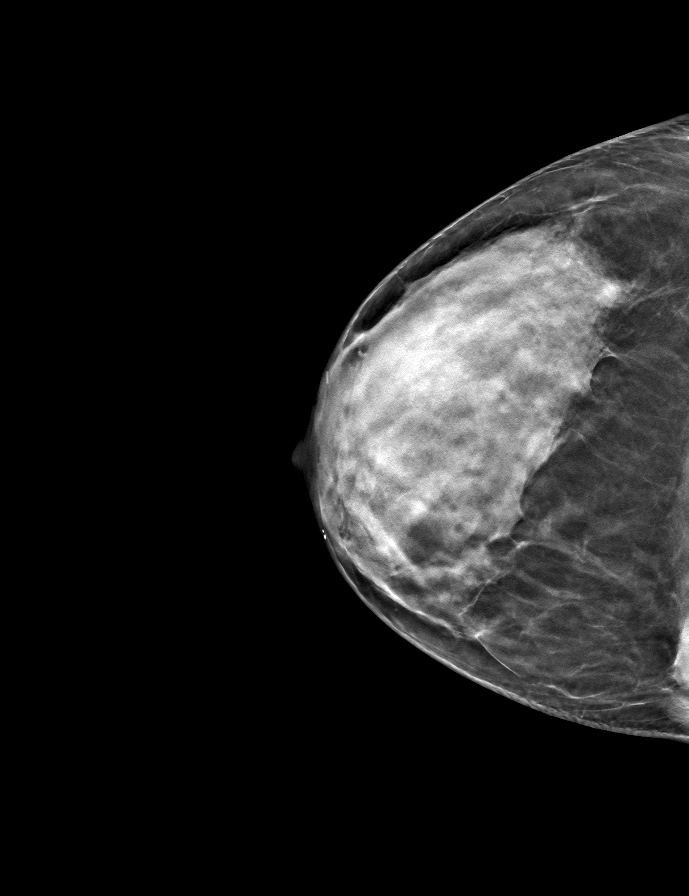

[L MLO tomo · tomo slice 26/51.0]
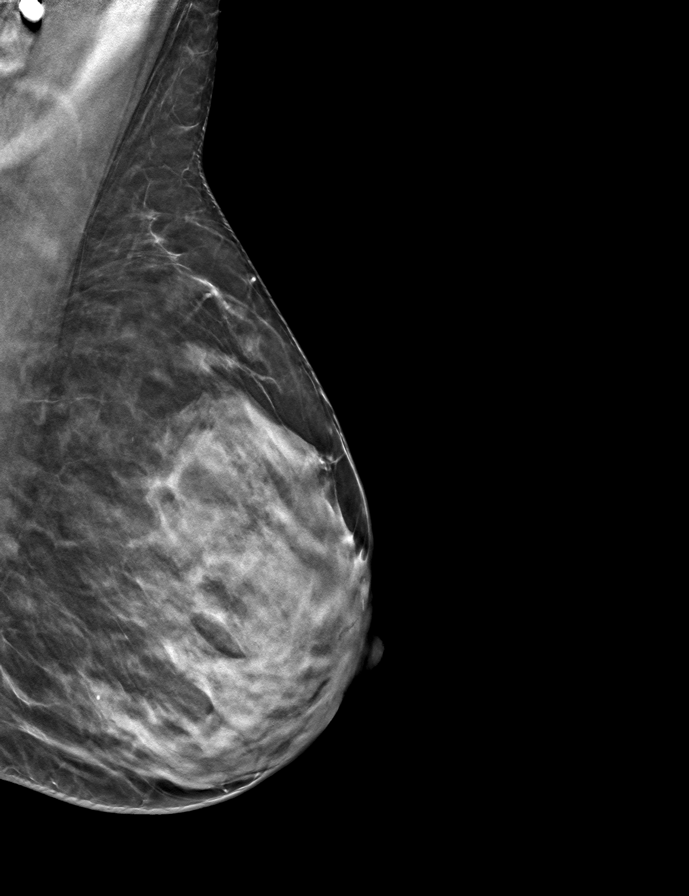

[9 of 24 positions shown; findings below may reference images not displayed]

ACR Breast Density Category c: The breast tissue is heterogeneously
dense, which may obscure small masses.
FINDINGS: In the left breast, possible masses warrant further evaluation. In
the right breast, no findings suspicious for malignancy.

Images were processed with CAD.
IMPRESSION: Further evaluation is suggested for possible masses in the left
breast.

RECOMMENDATION:
Diagnostic mammogram and possibly ultrasound of the left breast.
(Code:[MP])

The patient will be contacted regarding the findings, and additional
imaging will be scheduled.

BI-RADS CATEGORY  0: Incomplete. Need additional imaging evaluation
and/or prior mammograms for comparison.

## 2019-10-16 ENCOUNTER — Other Ambulatory Visit: Payer: Self-pay | Admitting: Nurse Practitioner

## 2019-10-16 ENCOUNTER — Other Ambulatory Visit: Payer: Self-pay | Admitting: Internal Medicine

## 2019-10-20 ENCOUNTER — Other Ambulatory Visit: Payer: Self-pay | Admitting: Nurse Practitioner

## 2019-10-20 DIAGNOSIS — N6320 Unspecified lump in the left breast, unspecified quadrant: Secondary | ICD-10-CM

## 2019-10-20 DIAGNOSIS — R928 Other abnormal and inconclusive findings on diagnostic imaging of breast: Secondary | ICD-10-CM

## 2019-10-21 ENCOUNTER — Other Ambulatory Visit (HOSPITAL_COMMUNITY): Payer: Self-pay | Admitting: Orthopaedic Surgery

## 2019-10-21 DIAGNOSIS — M25851 Other specified joint disorders, right hip: Secondary | ICD-10-CM | POA: Diagnosis not present

## 2019-10-21 DIAGNOSIS — M25852 Other specified joint disorders, left hip: Secondary | ICD-10-CM | POA: Diagnosis not present

## 2019-10-21 DIAGNOSIS — Z23 Encounter for immunization: Secondary | ICD-10-CM | POA: Diagnosis not present

## 2019-10-21 DIAGNOSIS — R079 Chest pain, unspecified: Secondary | ICD-10-CM | POA: Diagnosis not present

## 2019-10-21 DIAGNOSIS — R5383 Other fatigue: Secondary | ICD-10-CM | POA: Diagnosis not present

## 2019-10-21 DIAGNOSIS — M549 Dorsalgia, unspecified: Secondary | ICD-10-CM | POA: Diagnosis not present

## 2019-10-26 MED FILL — MELOXICAM 15 MG TABLET: 15 | 30 days supply | Qty: 30 | Fill #0

## 2019-10-28 ENCOUNTER — Ambulatory Visit: Payer: 59

## 2019-10-28 ENCOUNTER — Other Ambulatory Visit: Payer: Self-pay

## 2019-10-28 ENCOUNTER — Ambulatory Visit
Admission: RE | Admit: 2019-10-28 | Discharge: 2019-10-28 | Disposition: A | Discharge status: Home / Self Care | Payer: 59 | Source: Ambulatory Visit | Attending: Nurse Practitioner | Admitting: Nurse Practitioner

## 2019-10-28 DIAGNOSIS — R922 Inconclusive mammogram: Secondary | ICD-10-CM | POA: Diagnosis not present

## 2019-10-28 DIAGNOSIS — N6320 Unspecified lump in the left breast, unspecified quadrant: Secondary | ICD-10-CM

## 2019-10-28 DIAGNOSIS — R928 Other abnormal and inconclusive findings on diagnostic imaging of breast: Secondary | ICD-10-CM | POA: Diagnosis not present

## 2019-10-28 IMAGING — US US BREAST*L* LIMITED INC AXILLA
1 series · 10 of 10 positions shown · non-contrast
Comparison: Previous exam(s).

CLINICAL DATA: Screening recall for possible left breast masses.

EXAM:
DIGITAL DIAGNOSTIC UNILATERAL LEFT MAMMOGRAM WITH TOMO AND CAD;
ULTRASOUND LEFT BREAST LIMITED

[Series 1: us breast*left* limited inc axilla · 0.05mm/px · 10 of 10 slices shown]
[im 1/10]
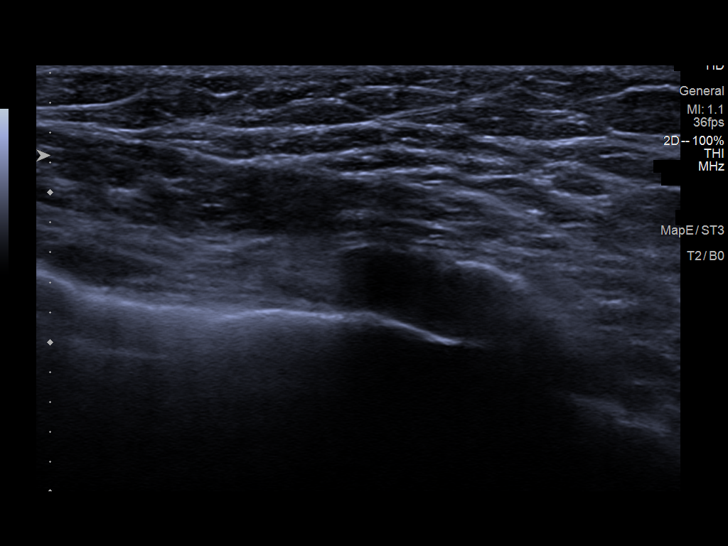
[im 2/10]
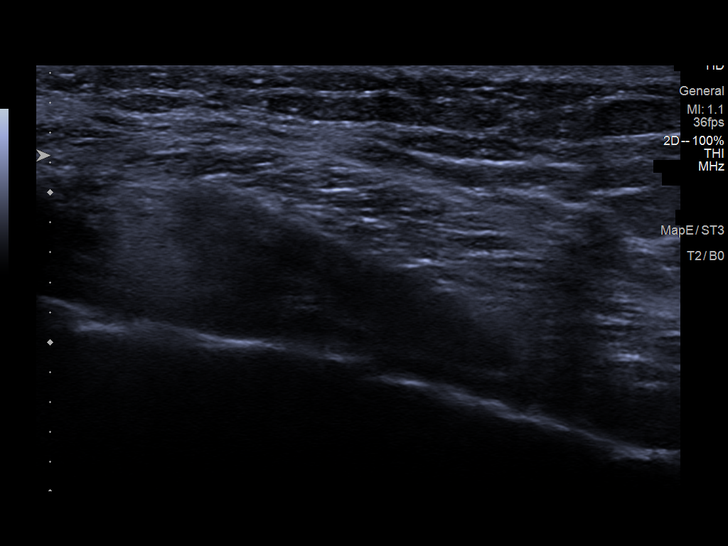
[im 3/10]
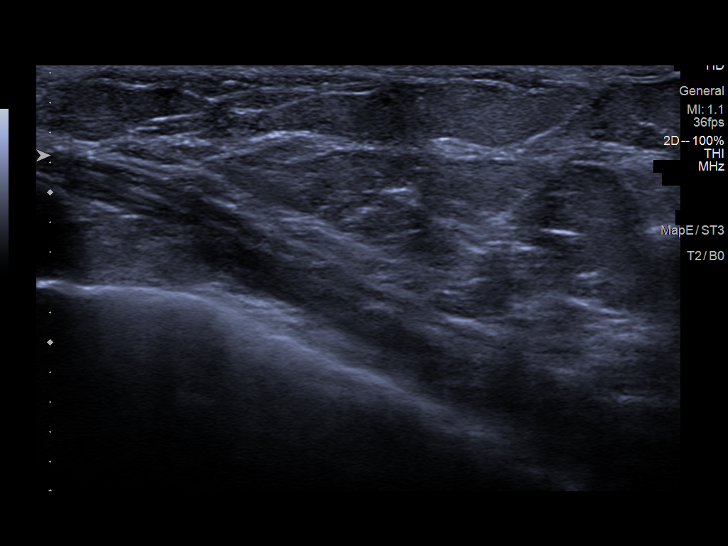
[im 4/10]
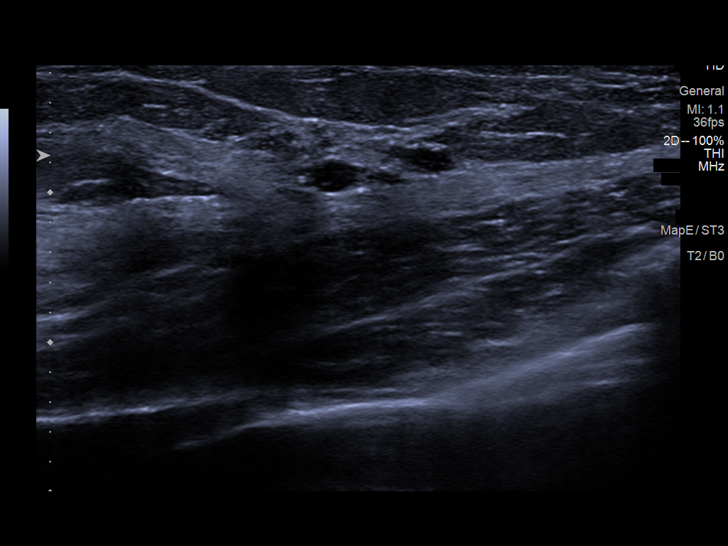
[im 5/10]
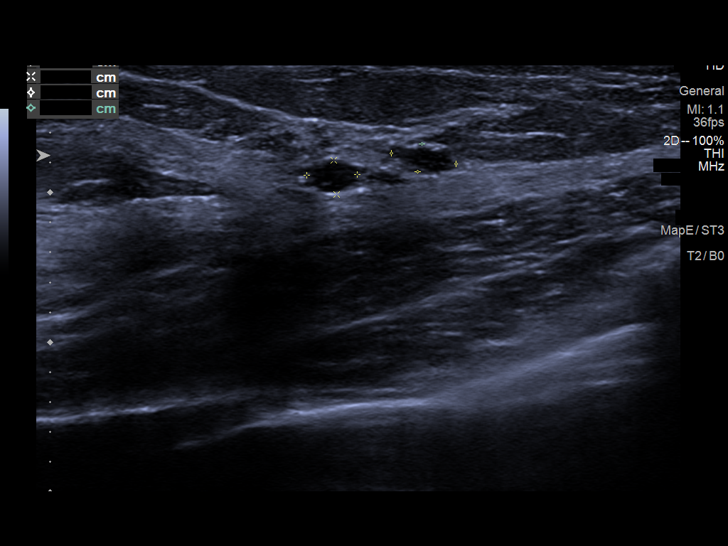
[im 6/10]
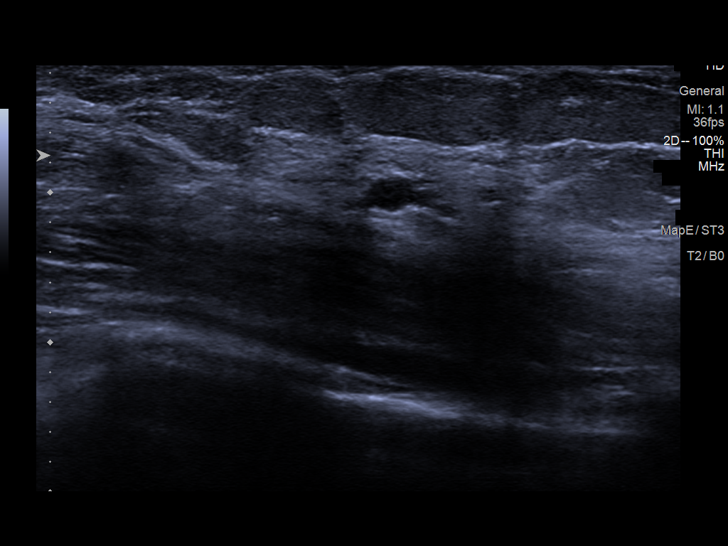
[im 7/10]
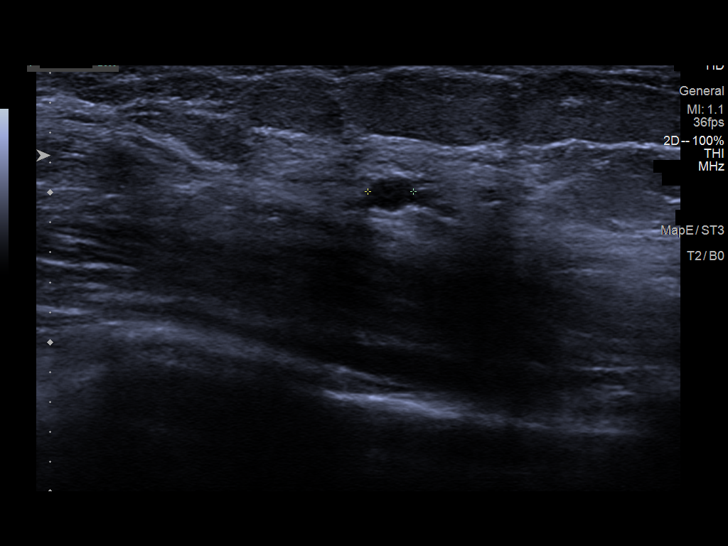
[im 8/10]
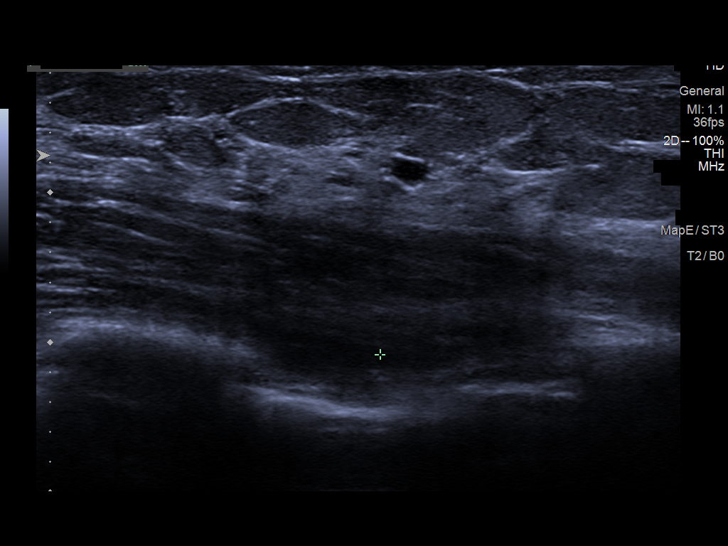
[im 9/10]
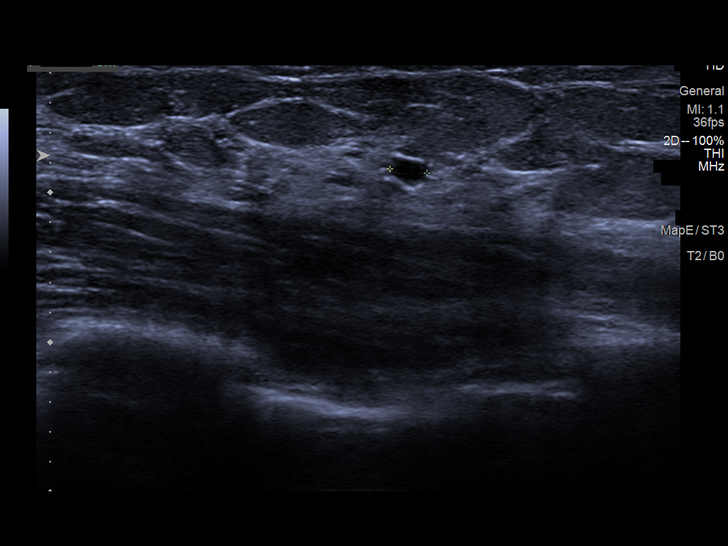
[im 10/10]
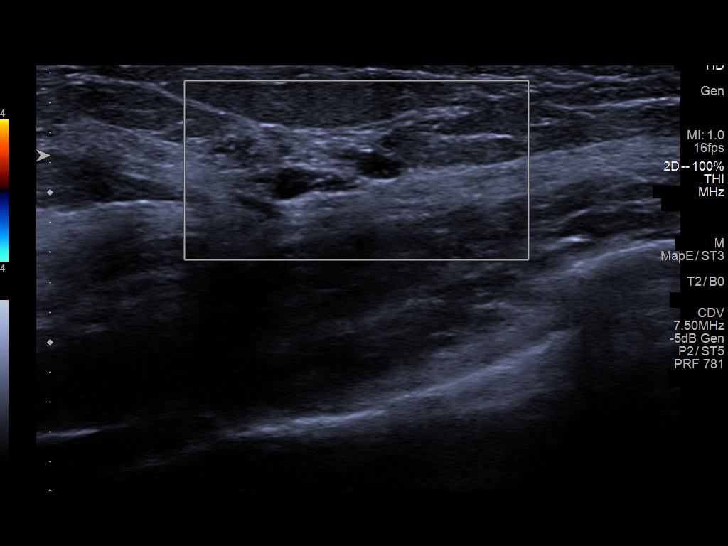

[10 of 10 positions shown; findings below may reference images not displayed]

ACR Breast Density Category c: The breast tissue is heterogeneously
dense, which may obscure small masses.
FINDINGS: Spot compression tomosynthesis images through the lateral posterior
left breast demonstrates 2 adjacent partially visualized
subcentimeter oval masses. Spot compression tomosynthesis imaging of
the medial left breast demonstrates a persistent partially
visualized asymmetry, favored to represent the anterior margin of
muscle. Ultrasound will be performed for confirmation.

Mammographic images were processed with CAD.

Physical exam of the medial aspect of the left breast demonstrates
no discrete palpable masses. Physical exam of the lateral left
breast demonstrates no discrete palpable masses, however tenderness
is elicited with palpation in the slightly upper outer quadrant,
posterior depth.

Ultrasound along the upper inner and lower inner quadrants of the
left breast near the cleavage line demonstrates normal
fibroglandular tissue. No masses are identified.

Ultrasound of the upper outer and lower outer quadrants of the left
breast demonstrates normal fibroglandular tissue. At the site of
tenderness in the left breast at [DATE], 6 cm from the nipple there
are 2 adjacent 3-4 mm benign cysts, likely corresponding with the
small masses seen mammographically.
IMPRESSION: 1. No persistent suspicious abnormalities are seen in the medial
left breast.

2. The masses in the lateral left breast correspond with benign
fibrocystic changes.

RECOMMENDATION:
Screening mammogram in one year.(Code:[T7])

I have discussed the findings and recommendations with the patient.
If applicable, a reminder letter will be sent to the patient
regarding the next appointment.

BI-RADS CATEGORY  2: Benign.

## 2019-10-28 IMAGING — MG MM DIGITAL DIAGNOSTIC UNILAT*L* W/ TOMO W/ CAD
6 of 10 series · 6 of 30 positions shown · non-contrast
Comparison: Previous exam(s).

CLINICAL DATA: Screening recall for possible left breast masses.

EXAM:
DIGITAL DIAGNOSTIC UNILATERAL LEFT MAMMOGRAM WITH TOMO AND CAD;
ULTRASOUND LEFT BREAST LIMITED

[L CC synth-2D]
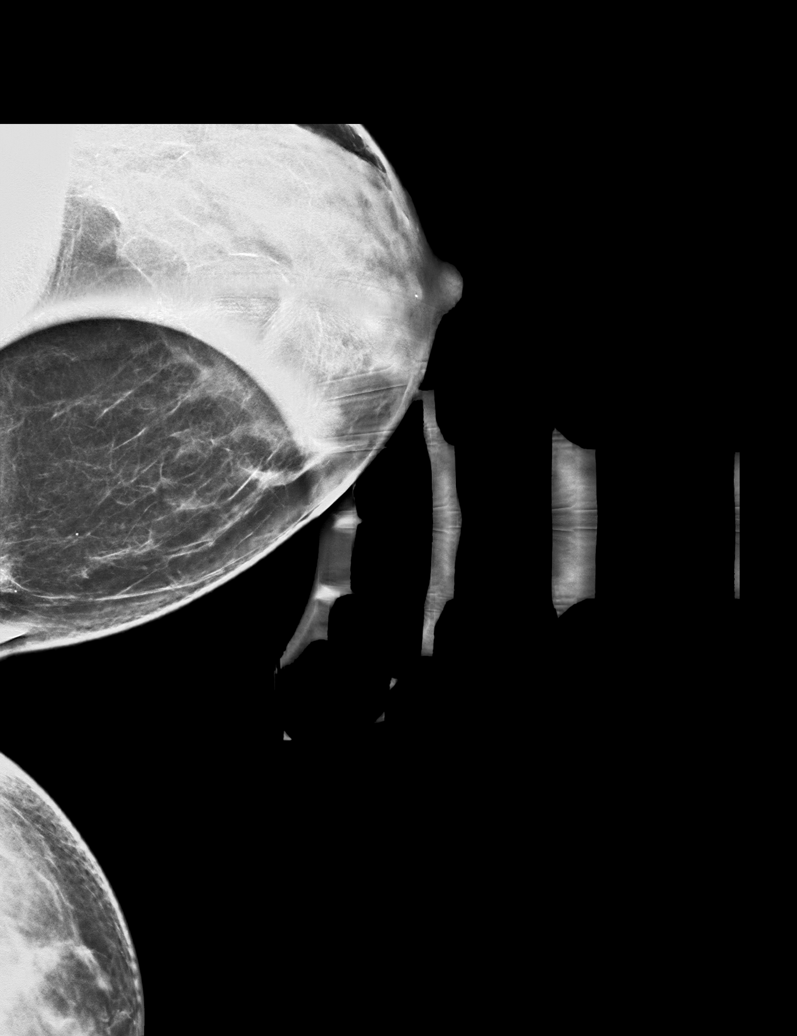

[L MLO synth-2D]
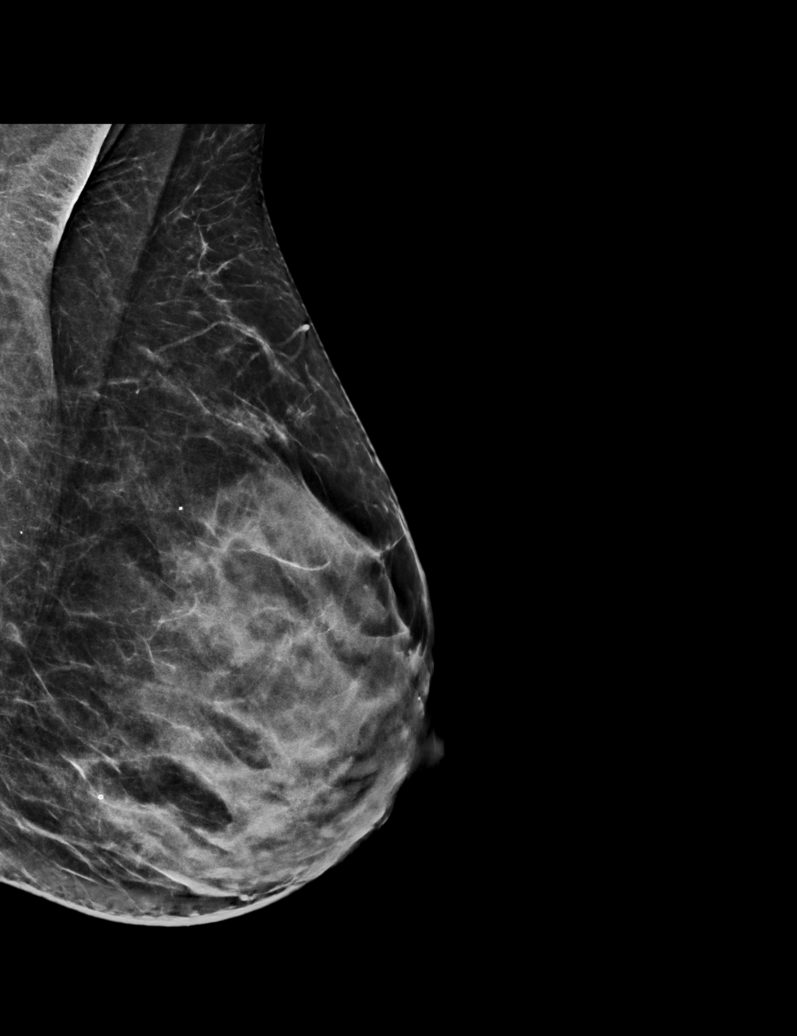

[L XCCL synth-2D]
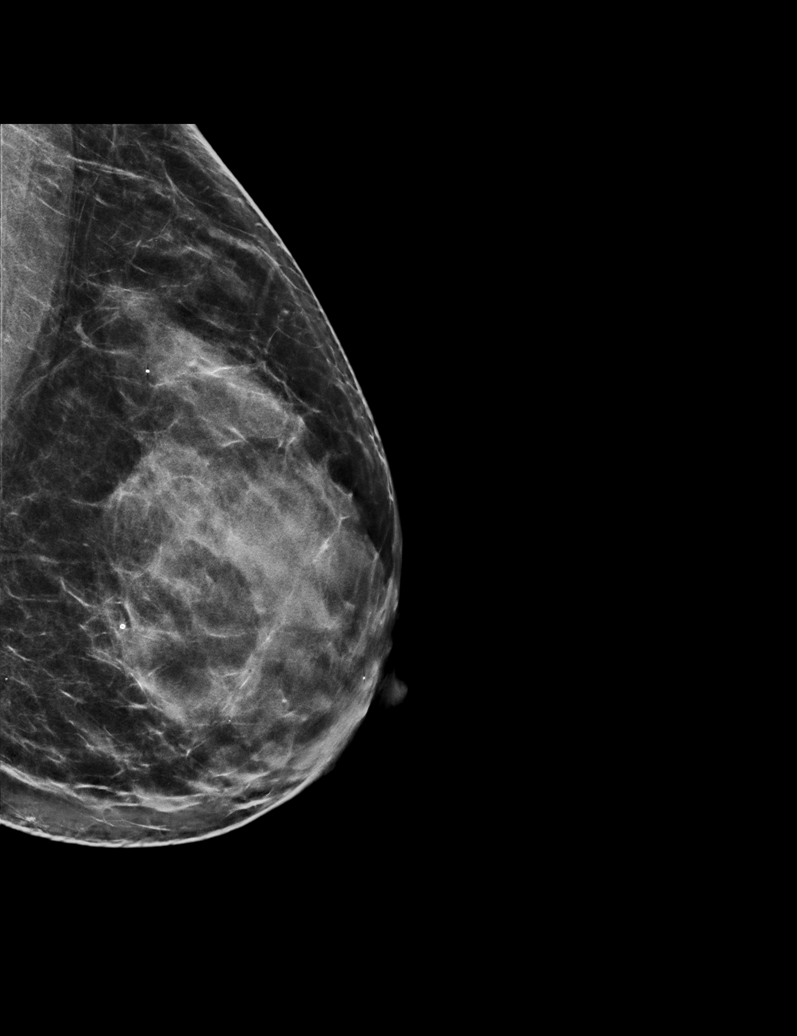

[L SIO synth-2D]
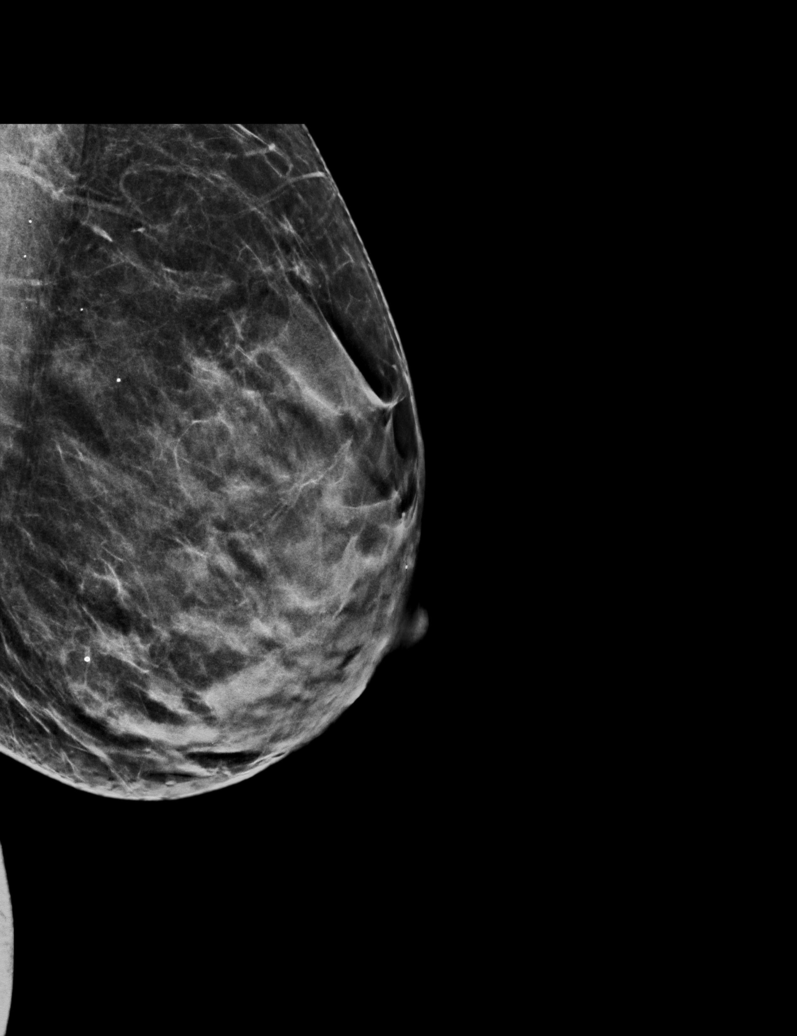

[L LM synth-2D]
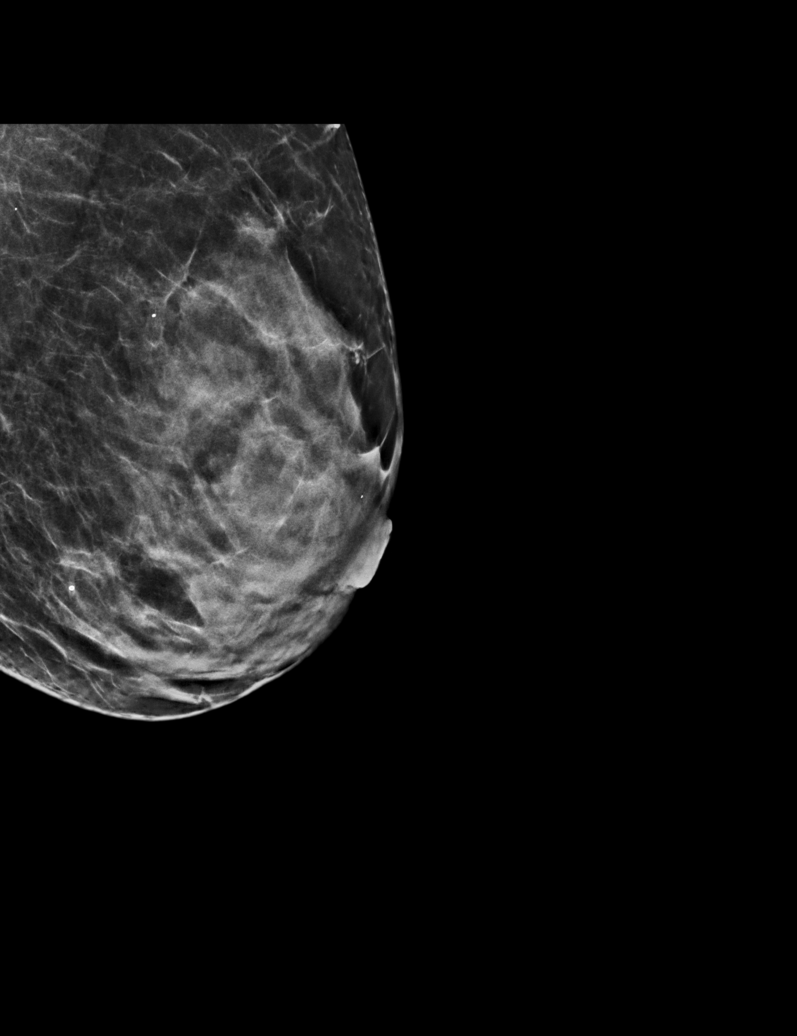

[L CC tomo · tomo slice 27/53.0]
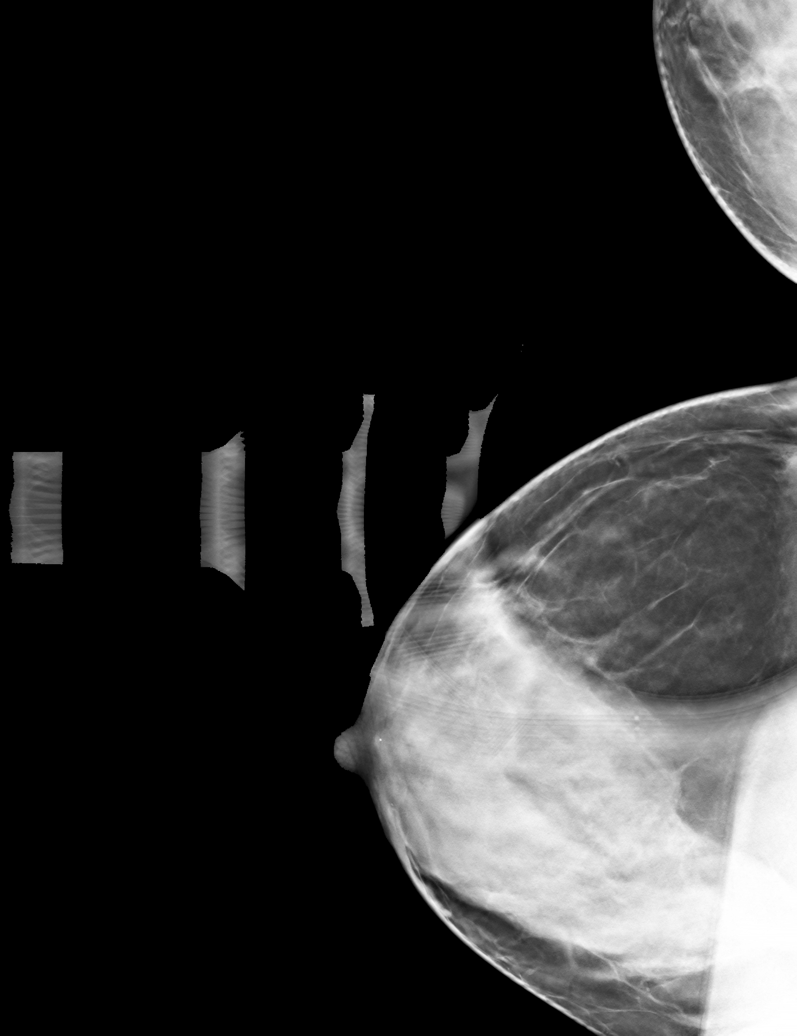

[6 of 30 positions shown; findings below may reference images not displayed]

ACR Breast Density Category c: The breast tissue is heterogeneously
dense, which may obscure small masses.
FINDINGS: Spot compression tomosynthesis images through the lateral posterior
left breast demonstrates 2 adjacent partially visualized
subcentimeter oval masses. Spot compression tomosynthesis imaging of
the medial left breast demonstrates a persistent partially
visualized asymmetry, favored to represent the anterior margin of
muscle. Ultrasound will be performed for confirmation.

Mammographic images were processed with CAD.

Physical exam of the medial aspect of the left breast demonstrates
no discrete palpable masses. Physical exam of the lateral left
breast demonstrates no discrete palpable masses, however tenderness
is elicited with palpation in the slightly upper outer quadrant,
posterior depth.

Ultrasound along the upper inner and lower inner quadrants of the
left breast near the cleavage line demonstrates normal
fibroglandular tissue. No masses are identified.

Ultrasound of the upper outer and lower outer quadrants of the left
breast demonstrates normal fibroglandular tissue. At the site of
tenderness in the left breast at [DATE], 6 cm from the nipple there
are 2 adjacent 3-4 mm benign cysts, likely corresponding with the
small masses seen mammographically.
IMPRESSION: 1. No persistent suspicious abnormalities are seen in the medial
left breast.

2. The masses in the lateral left breast correspond with benign
fibrocystic changes.

RECOMMENDATION:
Screening mammogram in one year.(Code:[T7])

I have discussed the findings and recommendations with the patient.
If applicable, a reminder letter will be sent to the patient
regarding the next appointment.

BI-RADS CATEGORY  2: Benign.

## 2019-11-09 DIAGNOSIS — M25551 Pain in right hip: Secondary | ICD-10-CM | POA: Diagnosis not present

## 2019-11-09 DIAGNOSIS — M25552 Pain in left hip: Secondary | ICD-10-CM | POA: Diagnosis not present

## 2019-11-18 DIAGNOSIS — M25552 Pain in left hip: Secondary | ICD-10-CM | POA: Diagnosis not present

## 2019-11-18 DIAGNOSIS — M25551 Pain in right hip: Secondary | ICD-10-CM | POA: Diagnosis not present

## 2019-12-02 DIAGNOSIS — M25551 Pain in right hip: Secondary | ICD-10-CM | POA: Diagnosis not present

## 2019-12-02 DIAGNOSIS — M25552 Pain in left hip: Secondary | ICD-10-CM | POA: Diagnosis not present

## 2019-12-08 ENCOUNTER — Other Ambulatory Visit (HOSPITAL_COMMUNITY): Payer: Self-pay | Admitting: Pulmonary Disease

## 2019-12-08 MED FILL — ALBUTEROL SULFATE HFA 108 (: 108 (90 BAS | 16 days supply | Qty: 18 | Fill #0

## 2019-12-09 DIAGNOSIS — M25551 Pain in right hip: Secondary | ICD-10-CM | POA: Diagnosis not present

## 2019-12-09 DIAGNOSIS — M25552 Pain in left hip: Secondary | ICD-10-CM | POA: Diagnosis not present

## 2019-12-11 DIAGNOSIS — M5416 Radiculopathy, lumbar region: Secondary | ICD-10-CM | POA: Diagnosis not present

## 2019-12-16 DIAGNOSIS — M25551 Pain in right hip: Secondary | ICD-10-CM | POA: Diagnosis not present

## 2019-12-16 DIAGNOSIS — M25552 Pain in left hip: Secondary | ICD-10-CM | POA: Diagnosis not present

## 2019-12-18 DIAGNOSIS — M5416 Radiculopathy, lumbar region: Secondary | ICD-10-CM | POA: Diagnosis not present

## 2019-12-23 DIAGNOSIS — L814 Other melanin hyperpigmentation: Secondary | ICD-10-CM | POA: Diagnosis not present

## 2019-12-23 DIAGNOSIS — L821 Other seborrheic keratosis: Secondary | ICD-10-CM | POA: Diagnosis not present

## 2019-12-23 DIAGNOSIS — M25552 Pain in left hip: Secondary | ICD-10-CM | POA: Diagnosis not present

## 2019-12-23 DIAGNOSIS — M25551 Pain in right hip: Secondary | ICD-10-CM | POA: Diagnosis not present

## 2019-12-23 DIAGNOSIS — L853 Xerosis cutis: Secondary | ICD-10-CM | POA: Diagnosis not present

## 2019-12-23 DIAGNOSIS — D2262 Melanocytic nevi of left upper limb, including shoulder: Secondary | ICD-10-CM | POA: Diagnosis not present

## 2019-12-23 DIAGNOSIS — L820 Inflamed seborrheic keratosis: Secondary | ICD-10-CM | POA: Diagnosis not present

## 2019-12-23 DIAGNOSIS — D225 Melanocytic nevi of trunk: Secondary | ICD-10-CM | POA: Diagnosis not present

## 2019-12-23 DIAGNOSIS — D2261 Melanocytic nevi of right upper limb, including shoulder: Secondary | ICD-10-CM | POA: Diagnosis not present

## 2019-12-23 DIAGNOSIS — D2271 Melanocytic nevi of right lower limb, including hip: Secondary | ICD-10-CM | POA: Diagnosis not present

## 2019-12-23 DIAGNOSIS — D2272 Melanocytic nevi of left lower limb, including hip: Secondary | ICD-10-CM | POA: Diagnosis not present

## 2019-12-29 ENCOUNTER — Other Ambulatory Visit: Payer: Self-pay | Admitting: Student

## 2019-12-29 DIAGNOSIS — M5416 Radiculopathy, lumbar region: Secondary | ICD-10-CM

## 2019-12-30 ENCOUNTER — Ambulatory Visit
Admission: RE | Admit: 2019-12-30 | Discharge: 2019-12-30 | Disposition: A | Discharge status: Home / Self Care | Payer: 59 | Source: Ambulatory Visit | Attending: Student | Admitting: Student

## 2019-12-30 ENCOUNTER — Other Ambulatory Visit: Payer: Self-pay

## 2019-12-30 DIAGNOSIS — M545 Low back pain, unspecified: Secondary | ICD-10-CM | POA: Diagnosis not present

## 2019-12-30 DIAGNOSIS — M5416 Radiculopathy, lumbar region: Secondary | ICD-10-CM | POA: Diagnosis present

## 2019-12-30 IMAGING — MR MR LUMBAR SPINE W/O CM
4 of 5 series · 26 of 48 positions shown · non-contrast
Comparison: None.

CLINICAL DATA: Right lower back and right leg pain.

EXAM:
MRI LUMBAR SPINE WITHOUT CONTRAST
TECHNIQUE: Multiplanar, multisequence MR imaging of the lumbar spine was
performed. No intravenous contrast was administered.

[Series 2: T2 · sagittal · 4.0mm · 0.81mm/px · 6 of 15 slices shown (1 of 2)]
[im 1/15]
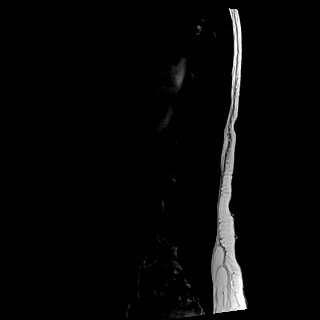
[im 3/15]
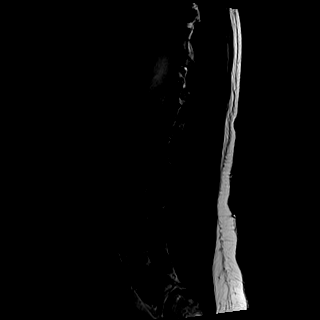
[im 6/15]
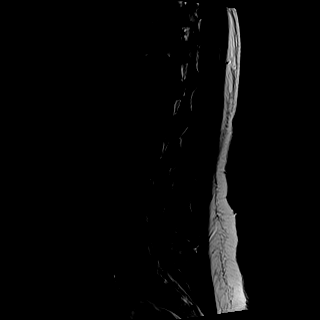
[im 9/15]
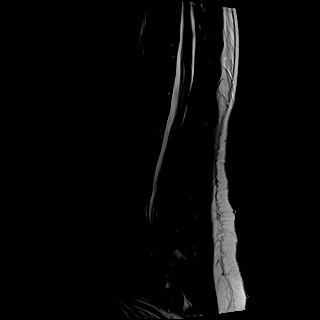
[im 12/15]
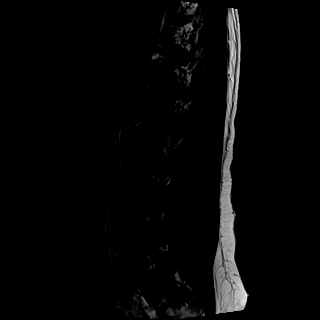
[im 15/15]
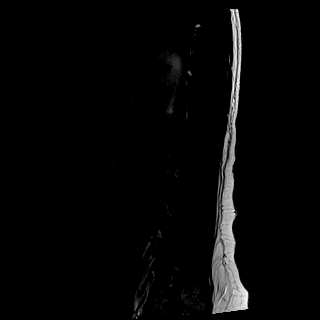

[Series 3: T1 · sagittal · 4.0mm · 0.41mm/px · 6 of 15 slices shown (1 of 2)]
[im 1/15]
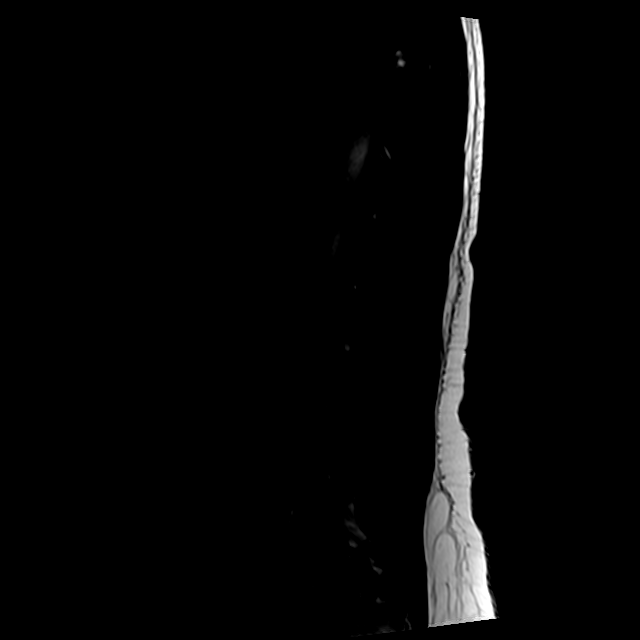
[im 3/15]
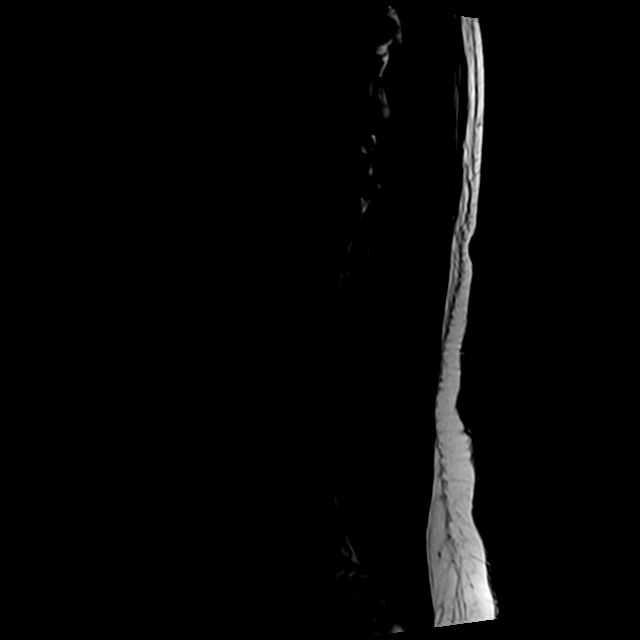
[im 6/15]
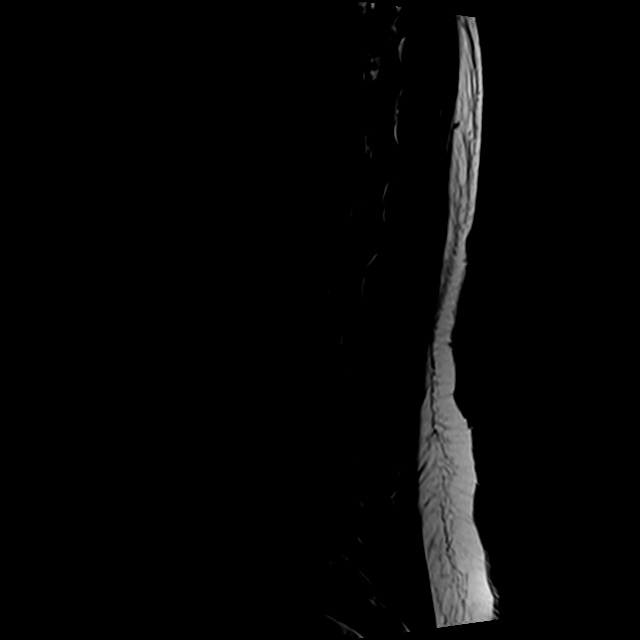
[im 9/15]
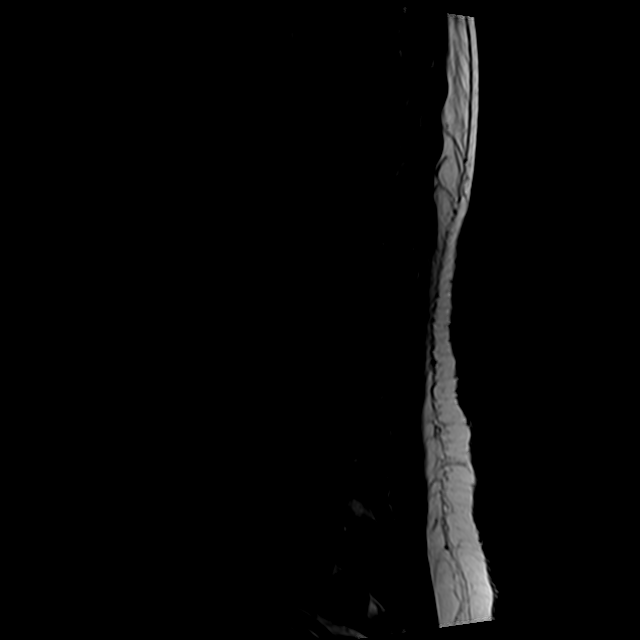
[im 12/15]
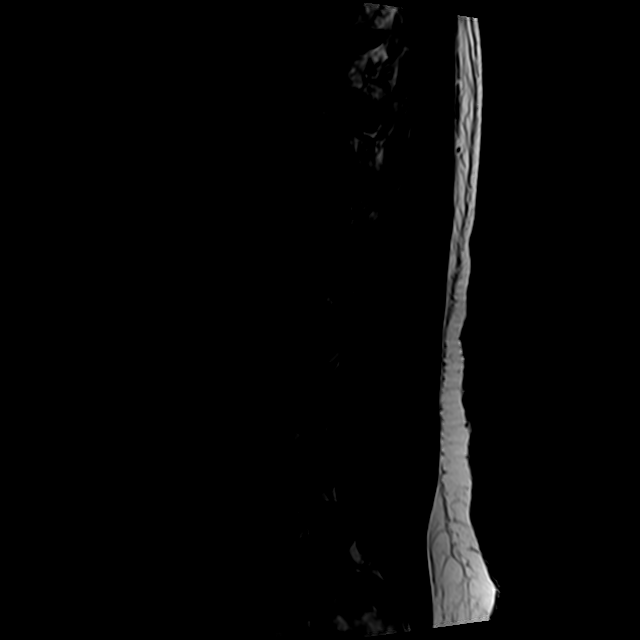
[im 15/15]
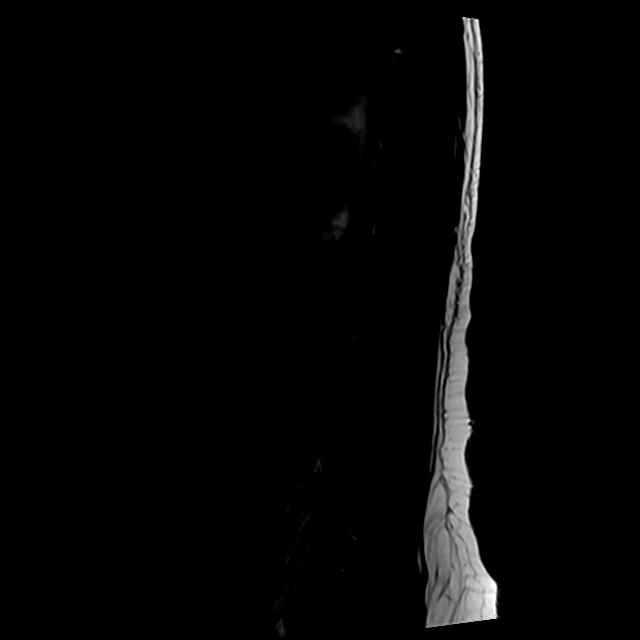

[Series 5: T2 · axial · 4.0mm · 0.78mm/px · z∈[-109,+95]mm · 9 of 38 slices shown (2 of 2)]
[im 1/38]
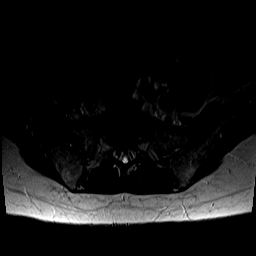
[im 6/38]
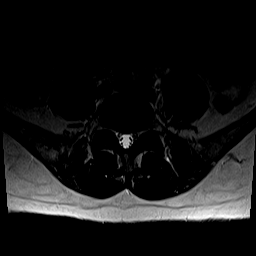
[im 11/38]
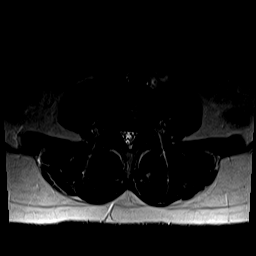
[im 16/38]
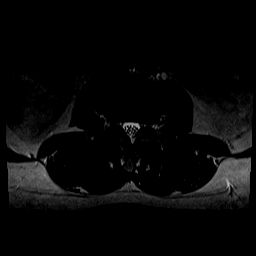
[im 19/38]
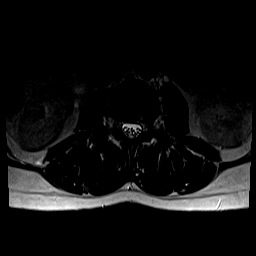
[im 22/38]
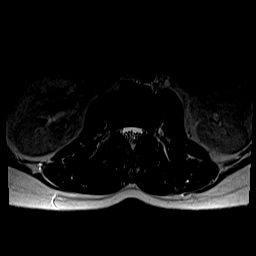
[im 27/38]
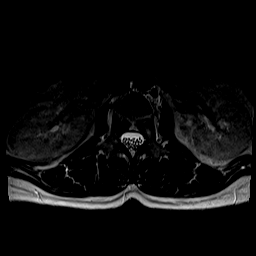
[im 32/38]
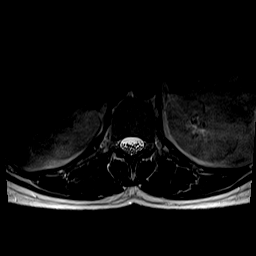
[im 38/38]
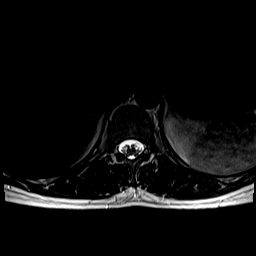

[Series 6: T1 · axial · 4.0mm · 0.39mm/px · z∈[-109,+65]mm · 5 of 38 slices shown (2 of 2)]
[im 1/38]
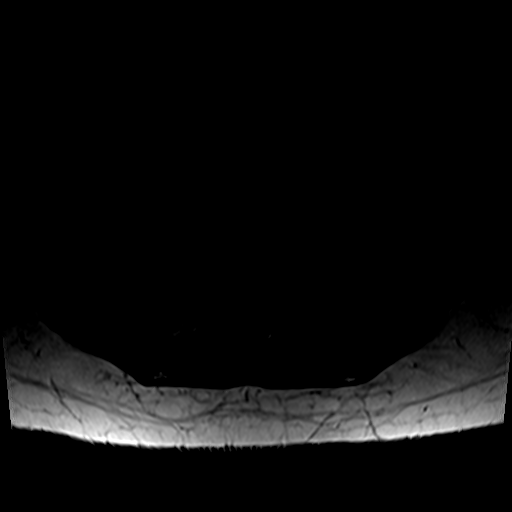
[im 6/38]
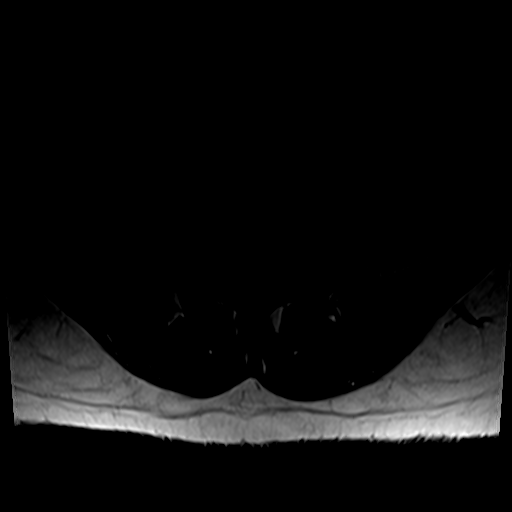
[im 11/38]
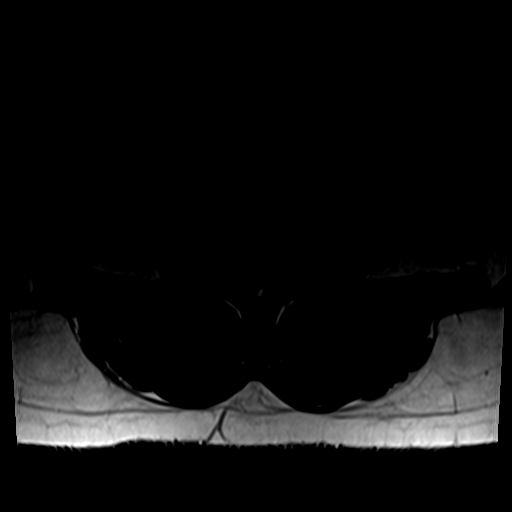
[im 19/38]
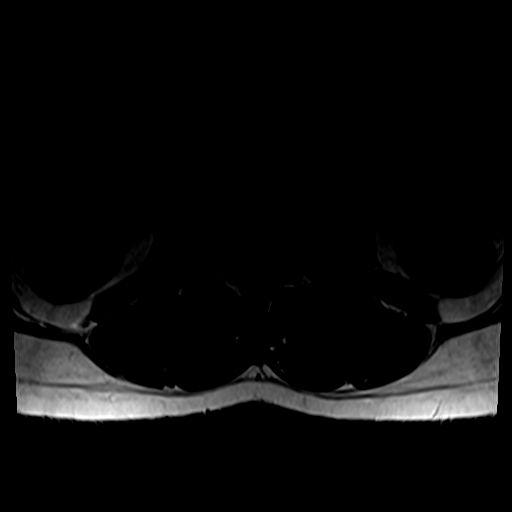
[im 32/38]
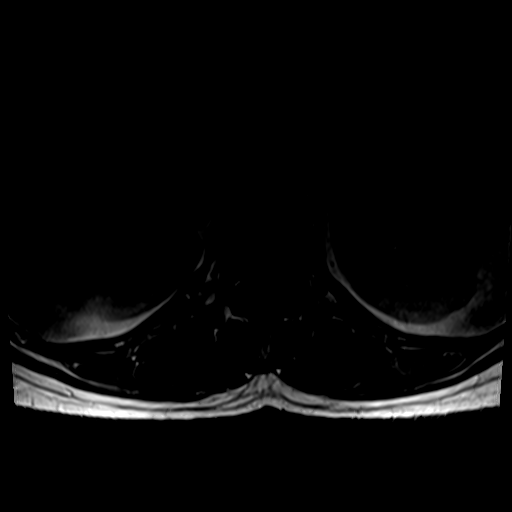

[26 of 48 positions shown; findings below may reference images not displayed]

FINDINGS: Segmentation: Standard. The lowest well-formed intervertebral disc
space is assumed to be the L5-S1 level.

Alignment:  Normal.

Vertebrae:  Vertebral body heights are preserved.  T11 hemangioma.

Conus medullaris and cauda equina: Conus extends to the L1 level.
Conus and cauda equina appear normal.

Disc levels: Mild desiccation most prominent at the L4-5 level.

L1-2: No significant disc bulge. Patent spinal canal and neural
foramen.

L2-3: No significant disc bulge. Patent spinal canal and neural
foramen.

L3-4: Mild disc bulge. Patent spinal canal. Mild bilateral neural
foraminal narrowing.

L4-5: Disc bulge with superimposed central protrusion/annular
fissuring. There is also a superimposed right
foraminal/extraforaminal protrusion abutting the exiting right L4
nerve and descending right L5 roots. Bilateral facet degenerative
spurring. Patent spinal canal. Moderate bilateral neural foraminal
narrowing.

L5-S1: Minimal disc bulge with shallow central protrusion. Facet
degenerative spurring. Patent spinal canal and neural foramen.

Paraspinal and other soft tissues: Negative.
IMPRESSION: Mild spondylosis.  No significant spinal canal narrowing.

Moderate bilateral L4-5 and mild bilateral L3-4 neural foraminal
narrowing.

Central and right foraminal/extraforaminal protrusions with annular
fissuring at the L4-5 level and abutment of the exiting right L4 and
descending right L5 nerve roots.

## 2020-01-13 DIAGNOSIS — M5126 Other intervertebral disc displacement, lumbar region: Secondary | ICD-10-CM | POA: Diagnosis not present

## 2020-01-15 ENCOUNTER — Other Ambulatory Visit (HOSPITAL_COMMUNITY): Payer: Self-pay | Admitting: Orthopaedic Surgery

## 2020-01-15 DIAGNOSIS — M25851 Other specified joint disorders, right hip: Secondary | ICD-10-CM

## 2020-01-20 ENCOUNTER — Other Ambulatory Visit (HOSPITAL_COMMUNITY): Payer: Self-pay | Admitting: Orthopaedic Surgery

## 2020-01-20 DIAGNOSIS — M25851 Other specified joint disorders, right hip: Secondary | ICD-10-CM

## 2020-01-29 ENCOUNTER — Other Ambulatory Visit (HOSPITAL_COMMUNITY): Payer: Self-pay | Admitting: Orthopaedic Surgery

## 2020-01-29 DIAGNOSIS — M25851 Other specified joint disorders, right hip: Secondary | ICD-10-CM

## 2020-02-01 ENCOUNTER — Ambulatory Visit
Admission: RE | Admit: 2020-02-01 | Discharge: 2020-02-01 | Disposition: A | Discharge status: Home / Self Care | Payer: 59 | Source: Ambulatory Visit | Attending: Orthopaedic Surgery | Admitting: Orthopaedic Surgery

## 2020-02-01 ENCOUNTER — Other Ambulatory Visit: Payer: Self-pay

## 2020-02-01 DIAGNOSIS — M76891 Other specified enthesopathies of right lower limb, excluding foot: Secondary | ICD-10-CM | POA: Diagnosis not present

## 2020-02-01 DIAGNOSIS — M25851 Other specified joint disorders, right hip: Secondary | ICD-10-CM

## 2020-02-01 DIAGNOSIS — M25752 Osteophyte, left hip: Secondary | ICD-10-CM | POA: Diagnosis not present

## 2020-02-01 DIAGNOSIS — M25552 Pain in left hip: Secondary | ICD-10-CM | POA: Insufficient documentation

## 2020-02-01 DIAGNOSIS — M76892 Other specified enthesopathies of left lower limb, excluding foot: Secondary | ICD-10-CM | POA: Diagnosis not present

## 2020-02-01 DIAGNOSIS — M25551 Pain in right hip: Secondary | ICD-10-CM | POA: Diagnosis not present

## 2020-02-01 IMAGING — MR MR HIP*R* W/CM
4 of 6 series · 17 of 40 positions shown · IV contrast (multihance)
Comparison: INJECTION IMAGES FROM [DATE] [DATE]
COMPARISON: INJECTION IMAGES FROM [DATE] [DATE]

Addendum:
CLINICAL DATA: Bilateral hip pain, worsening for the last 6 months
with decreased range of motion

EXAM:
MRI ARTHROGRAM OF THE RIGHT HIP WITH INTRA-ARTICULAR CONTRAST
MRI ARTHROGRAM OF THE LEFT HIP WITH INTRA-ARTICULAR CONTRAST
TECHNIQUE: Multiplanar, multisequence MR imaging was performed following the
administration of intravenous contrast.
CONTRAST:  0.05mL MULTIHANCE GADOBENATE DIMEGLUMINE 529 MG/ML IV
SOLN; 0.05mL GADAVIST GADOBUTROL 1 MMOL/ML IV SOLN

[Series 8: T1 · coronal · B · 4.0mm · 0.59mm/px · 5 of 38 slices shown]
[im 1/38]
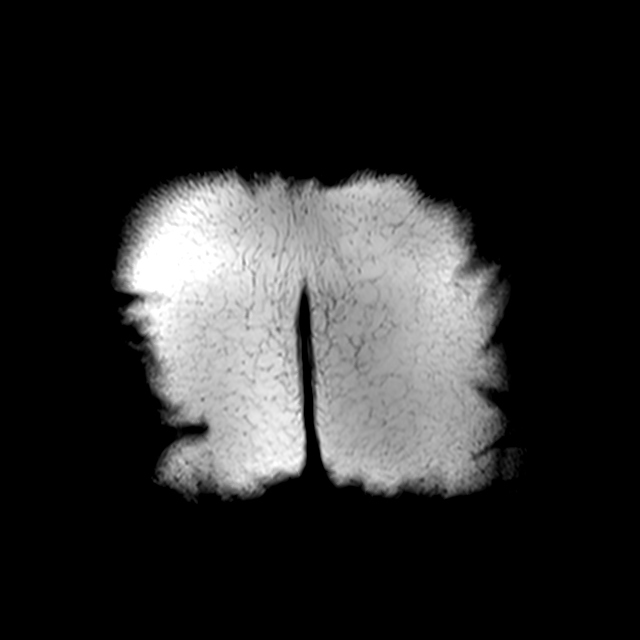
[im 5/38]
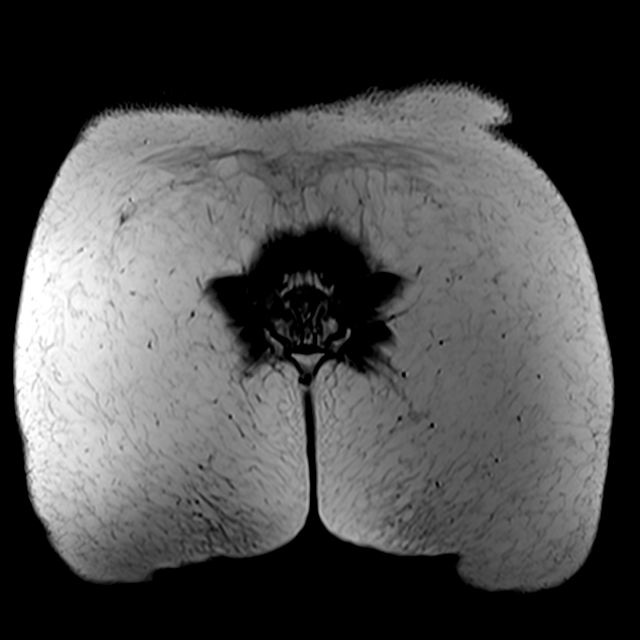
[im 10/38]
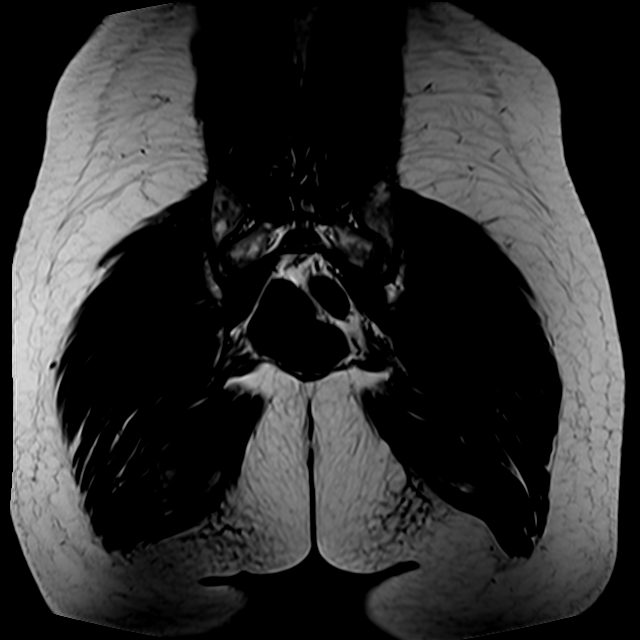
[im 19/38]
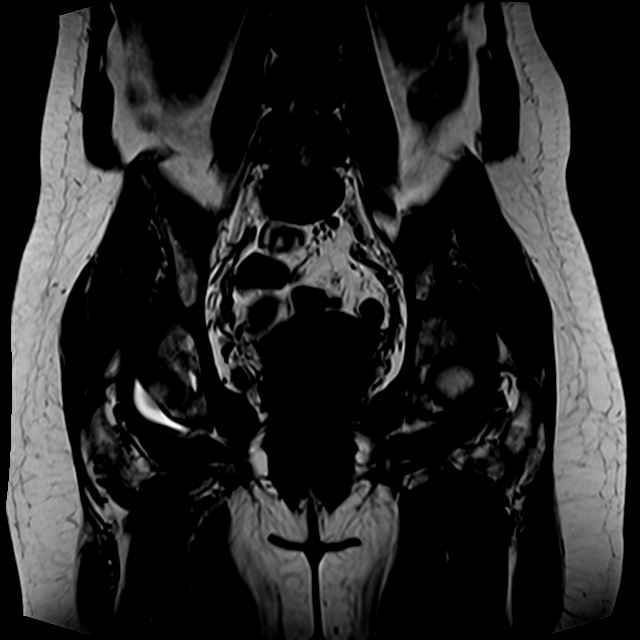
[im 33/38]
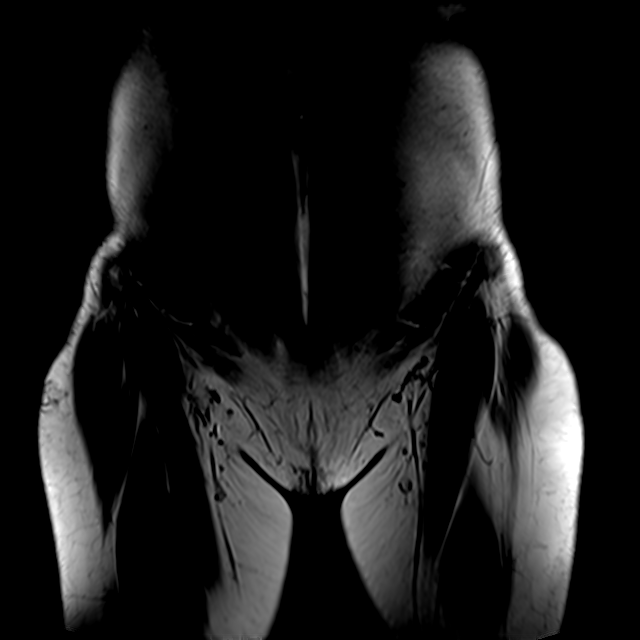

[Series 10: T2 fat-sat · axial · B · 4.0mm · 0.70mm/px · z∈[-116,+29]mm · 6 of 30 slices shown]
[im 1/30]
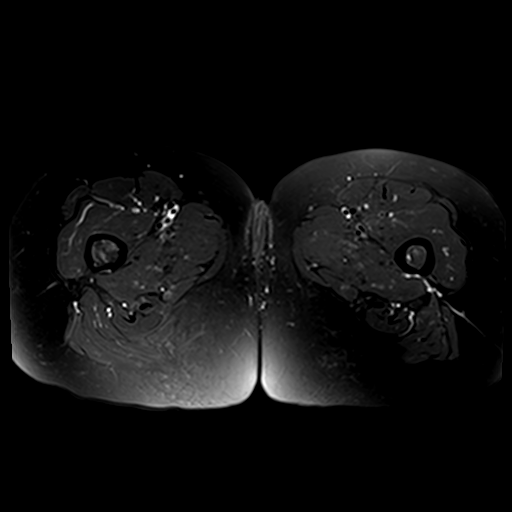
[im 6/30]
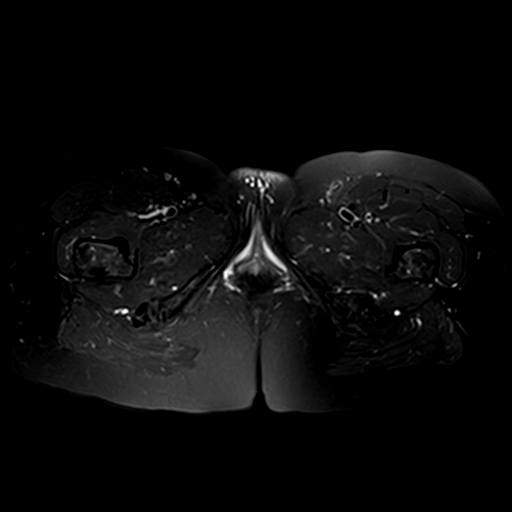
[im 12/30]
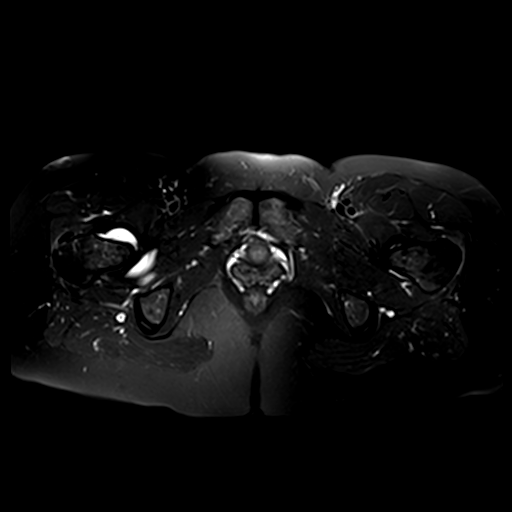
[im 18/30]
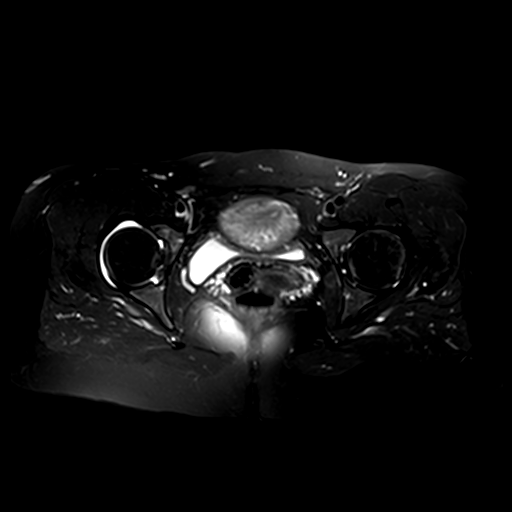
[im 24/30]
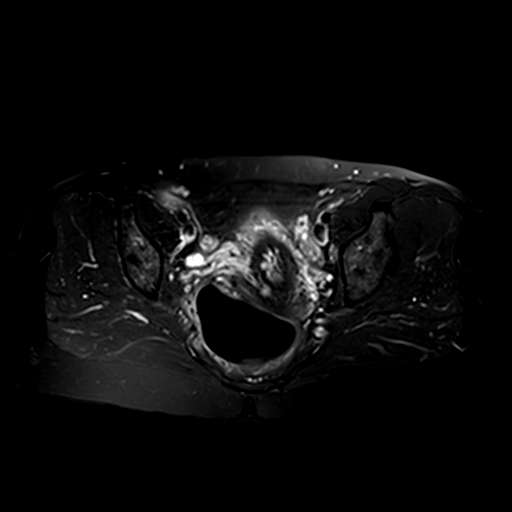
[im 30/30]
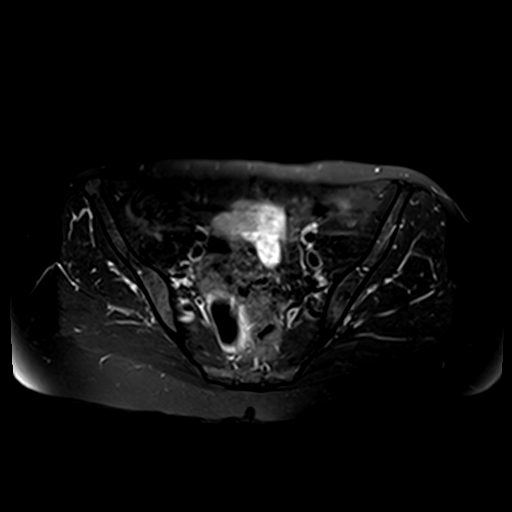

[Series 11: T1 fat-sat · axial · B · 4.0mm · 0.70mm/px · z∈[-129,-35]mm · 3 of 30 slices shown (1 of 2)]
[im 6/30]
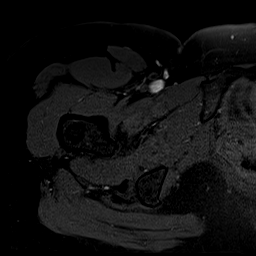
[im 18/30]
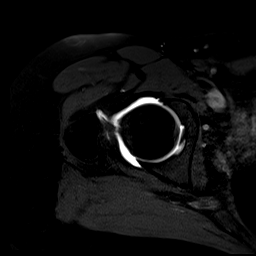
[im 30/30]
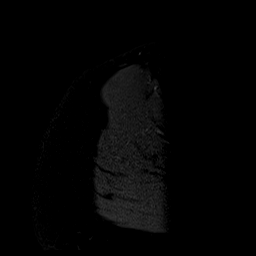

[Series 12: T1 fat-sat · sagittal · B · 4.0mm · 0.62mm/px · 3 of 25 slices shown (2 of 2)]
[im 1/25]
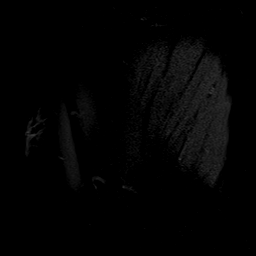
[im 13/25]
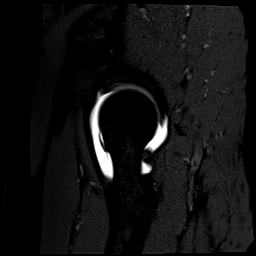
[im 25/25]
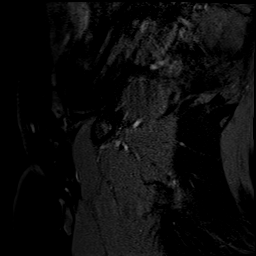

[17 of 40 positions shown; findings below may reference images not displayed]

FINDINGS: MRI ARTHROGRAM OF THE RIGHT HIP:

Bones/joints: Minimal spurring of the right femoral head.

In the inferomedial portion of the right femoral head, a 1.4 by
by 0.8 cm lesion with high T2 and intermediate T1 signal
characteristics is present. No contrast extension into this lesion
to suggest a geode connecting to the joint. Statistically this is
most likely to be a benign lesion such as an enchondroma, but the
lesion is technically nonspecific and could take on additional
significance if the patient has a history of malignancy with
predilection for bony spread. No other similar bony lesions are
identified in the bony pelvis or lung visualized lumbar spine.

Normal spherical morphology of the right femoral head. There is a
suggestion of mild spurring of the acetabulum. Pubis unremarkable.
No specific SI joint abnormality is observed. No free fragments are
observed in the right hip joint, although there are some tiny gas
bubbles in the non dependent portion of the joint.

Articular cartilage and labrum

Articular cartilage:  No abnormality observed.

Labrum: Scalloping along the superior labral base for example on
image 16 of series 13, raising suspicion for a shallow broad labral
tear.

Bursa: No regional bursitis.

Muscles and tendons

Muscles and tendons:  Unremarkable

Other findings

Miscellaneous: Sigmoid colon diverticulosis. Cervical nabothian
cysts. Suspect IUD along the endometrium.

--------------------------------

MRI ARTHROGRAM OF THE LEFT HIP:

Bones: Very minimal spurring of the left femoral head.

Articular cartilage and labrum

Articular cartilage:  Unremarkable

Labrum:  Unremarkable

Bursa: No regional bursitis.

Muscles and tendons

Muscles and tendons:  Unremarkable

Other findings

Miscellaneous:   As above
IMPRESSION: 1. Nonspecific lesion in the right femoral head inferomedially has
high T2 and intermediate T1 signal characteristics. This is most
likely a benign lesion, such as an enchondroma, and does not
communicate with the joint. If the patient has a history of
malignancy with a predilection for bony spread, such as breast
cancer or lung cancer, then further workup by bone scan or PET-CT
might be warranted.
2. Suspected shallow broad labral tear of the right upper acetabular
labrum based on a scalloped appearance of the labral surface.
3. Mild spurring of both femoral heads. Minimal spurring of the
right acetabulum.
4. Sigmoid colon diverticulosis.

ADDENDUM:
Examination was reviewed on [DATE] at [BE] hours.

Left superior labrum is degenerated with fraying along the free edge
and a probable tiny tear of the superior anterior labrum.

Severe right superior labral degeneration with a tear of the
superior anterior labrum.

*** End of Addendum ***
FINDINGS: MRI ARTHROGRAM OF THE RIGHT HIP:

Bones/joints: Minimal spurring of the right femoral head.

In the inferomedial portion of the right femoral head, a 1.4 by
by 0.8 cm lesion with high T2 and intermediate T1 signal
characteristics is present. No contrast extension into this lesion
to suggest a geode connecting to the joint. Statistically this is
most likely to be a benign lesion such as an enchondroma, but the
lesion is technically nonspecific and could take on additional
significance if the patient has a history of malignancy with
predilection for bony spread. No other similar bony lesions are
identified in the bony pelvis or lung visualized lumbar spine.

Normal spherical morphology of the right femoral head. There is a
suggestion of mild spurring of the acetabulum. Pubis unremarkable.
No specific SI joint abnormality is observed. No free fragments are
observed in the right hip joint, although there are some tiny gas
bubbles in the non dependent portion of the joint.

Articular cartilage and labrum

Articular cartilage:  No abnormality observed.

Labrum: Scalloping along the superior labral base for example on
image 16 of series 13, raising suspicion for a shallow broad labral
tear.

Bursa: No regional bursitis.

Muscles and tendons

Muscles and tendons:  Unremarkable

Other findings

Miscellaneous: Sigmoid colon diverticulosis. Cervical nabothian
cysts. Suspect IUD along the endometrium.

--------------------------------

MRI ARTHROGRAM OF THE LEFT HIP:

Bones: Very minimal spurring of the left femoral head.

Articular cartilage and labrum

Articular cartilage:  Unremarkable

Labrum:  Unremarkable

Bursa: No regional bursitis.

Muscles and tendons

Muscles and tendons:  Unremarkable

Other findings

Miscellaneous:   As above
IMPRESSION: 1. Nonspecific lesion in the right femoral head inferomedially has
high T2 and intermediate T1 signal characteristics. This is most
likely a benign lesion, such as an enchondroma, and does not
communicate with the joint. If the patient has a history of
malignancy with a predilection for bony spread, such as breast
cancer or lung cancer, then further workup by bone scan or PET-CT
might be warranted.
2. Suspected shallow broad labral tear of the right upper acetabular
labrum based on a scalloped appearance of the labral surface.
3. Mild spurring of both femoral heads. Minimal spurring of the
right acetabulum.
4. Sigmoid colon diverticulosis.

## 2020-02-01 IMAGING — MR MR HIP*L* W/CM
4 of 6 series · 17 of 40 positions shown · IV contrast (multihance)
Comparison: INJECTION IMAGES FROM [DATE] [DATE]
COMPARISON: INJECTION IMAGES FROM [DATE] [DATE]

Addendum:
CLINICAL DATA: Bilateral hip pain, worsening for the last 6 months
with decreased range of motion

EXAM:
MRI ARTHROGRAM OF THE RIGHT HIP WITH INTRA-ARTICULAR CONTRAST
MRI ARTHROGRAM OF THE LEFT HIP WITH INTRA-ARTICULAR CONTRAST
TECHNIQUE: Multiplanar, multisequence MR imaging was performed following the
administration of intravenous contrast.
CONTRAST:  0.05mL MULTIHANCE GADOBENATE DIMEGLUMINE 529 MG/ML IV
SOLN; 0.05mL GADAVIST GADOBUTROL 1 MMOL/ML IV SOLN

[Series 14: T1 · coronal · B · 4.0mm · 0.59mm/px · 5 of 38 slices shown]
[im 1/38]
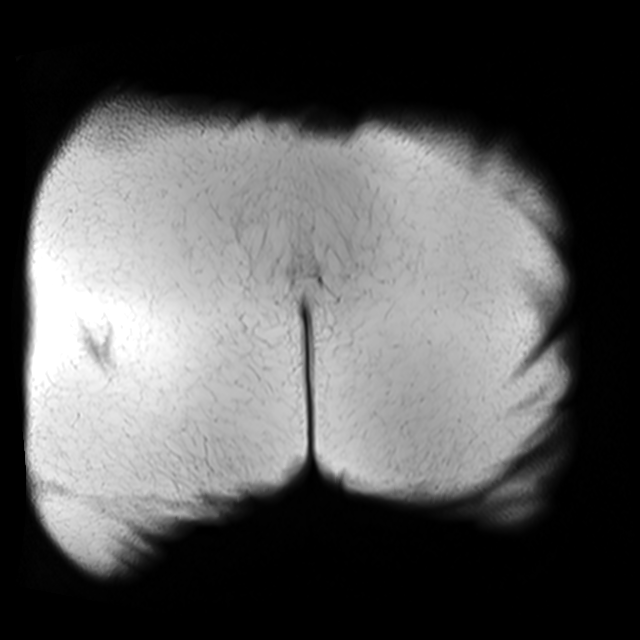
[im 6/38]
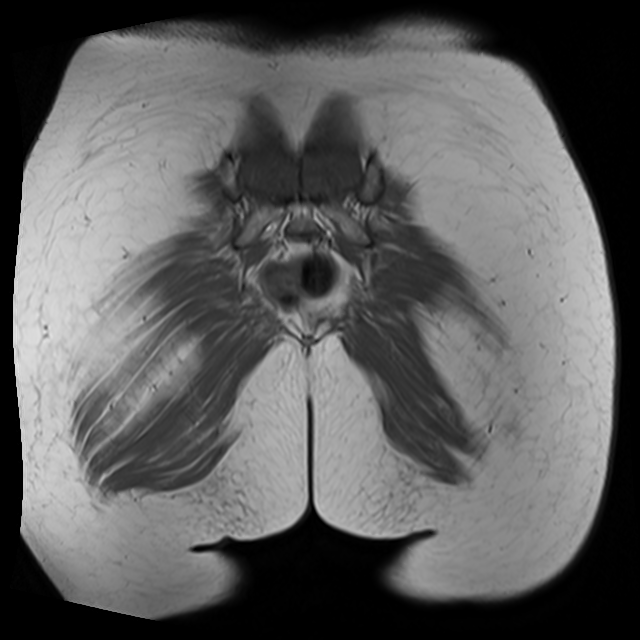
[im 11/38]
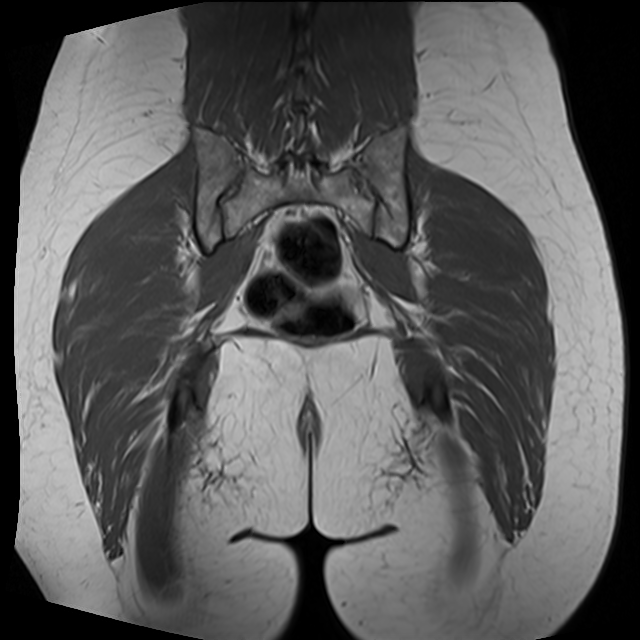
[im 22/38]
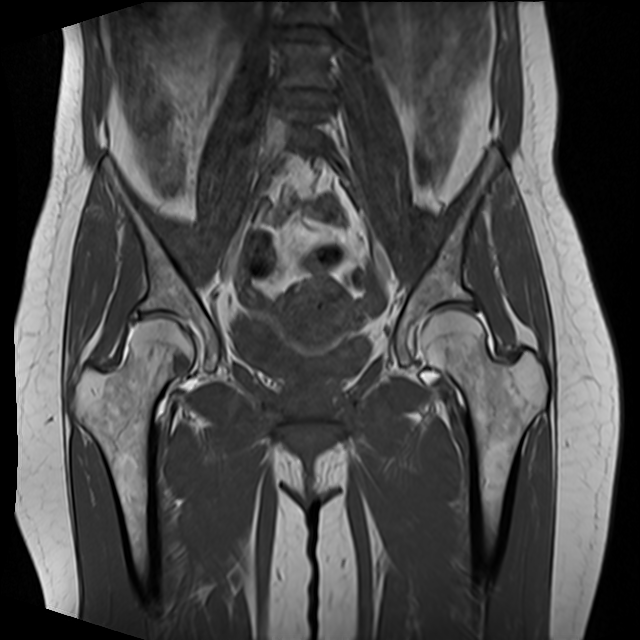
[im 32/38]
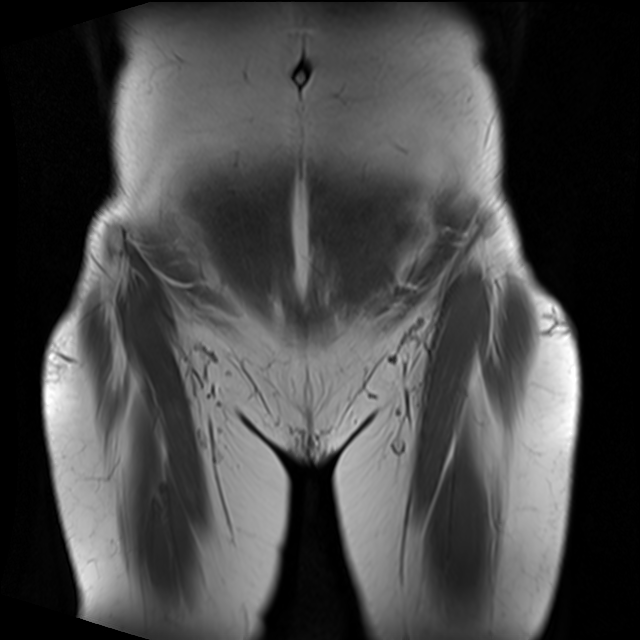

[Series 16: T2 fat-sat · axial · B · 4.0mm · 0.70mm/px · z∈[-107,+38]mm · 6 of 30 slices shown]
[im 1/30]
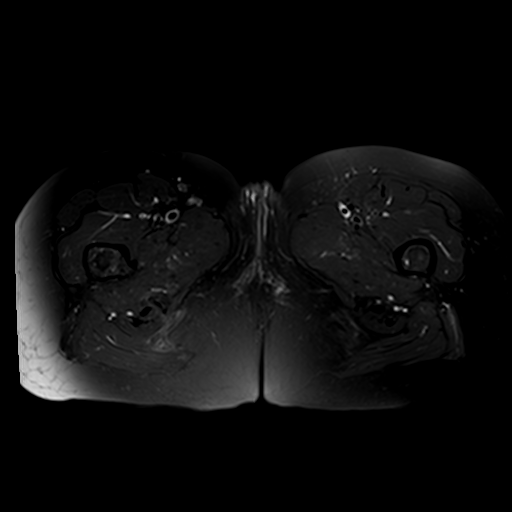
[im 6/30]
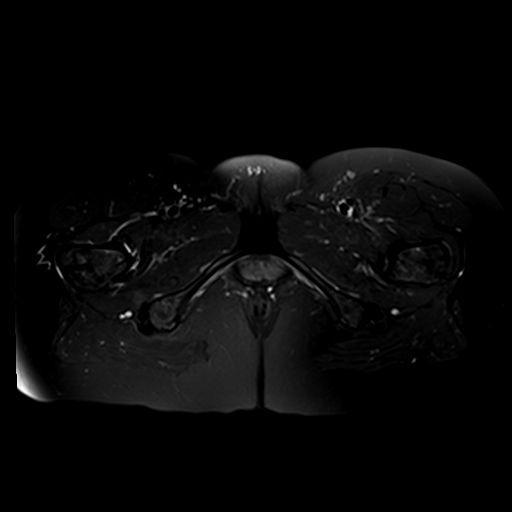
[im 12/30]
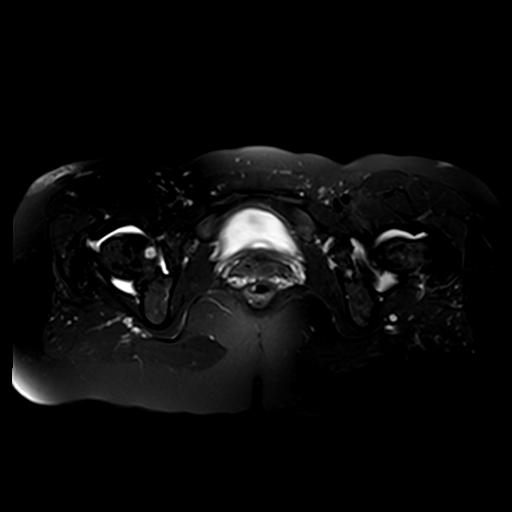
[im 18/30]
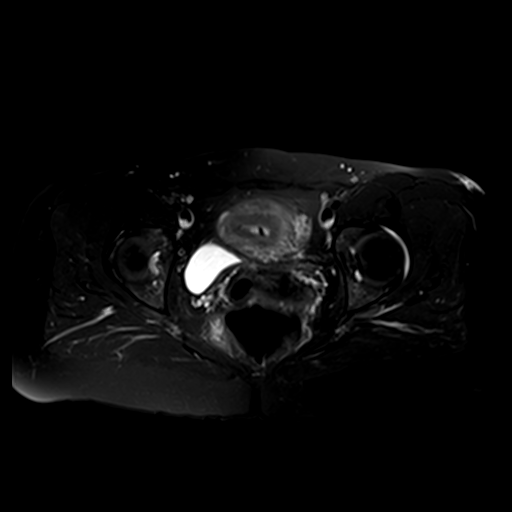
[im 24/30]
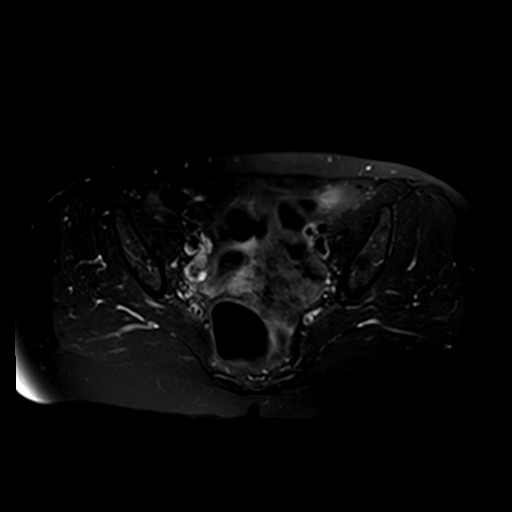
[im 30/30]
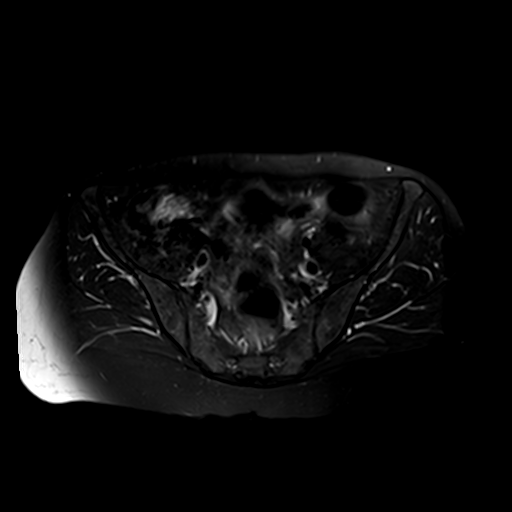

[Series 17: T1 fat-sat · axial · B · 4.0mm · 0.70mm/px · z∈[-50,+50]mm · 3 of 30 slices shown (1 of 2)]
[im 6/30]
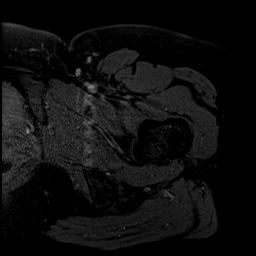
[im 18/30]
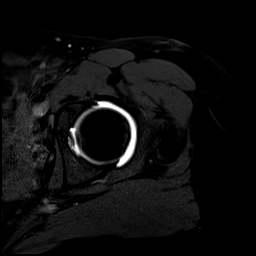
[im 30/30]
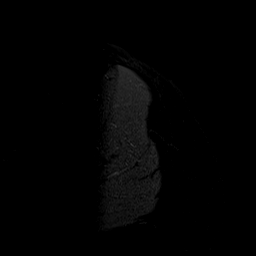

[Series 18: T1 fat-sat · sagittal · B · 4.0mm · 0.62mm/px · 3 of 29 slices shown (2 of 2)]
[im 6/29]
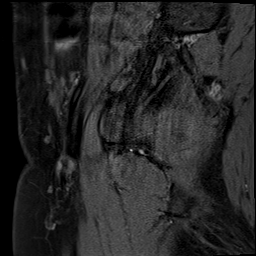
[im 17/29]
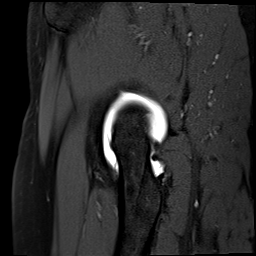
[im 29/29]
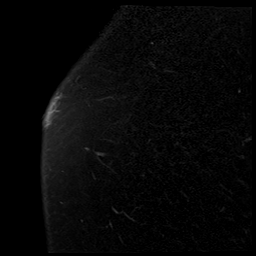

[17 of 40 positions shown; findings below may reference images not displayed]

FINDINGS: MRI ARTHROGRAM OF THE RIGHT HIP:

Bones/joints: Minimal spurring of the right femoral head.

In the inferomedial portion of the right femoral head, a 1.4 by
by 0.8 cm lesion with high T2 and intermediate T1 signal
characteristics is present. No contrast extension into this lesion
to suggest a geode connecting to the joint. Statistically this is
most likely to be a benign lesion such as an enchondroma, but the
lesion is technically nonspecific and could take on additional
significance if the patient has a history of malignancy with
predilection for bony spread. No other similar bony lesions are
identified in the bony pelvis or lung visualized lumbar spine.

Normal spherical morphology of the right femoral head. There is a
suggestion of mild spurring of the acetabulum. Pubis unremarkable.
No specific SI joint abnormality is observed. No free fragments are
observed in the right hip joint, although there are some tiny gas
bubbles in the non dependent portion of the joint.

Articular cartilage and labrum

Articular cartilage:  No abnormality observed.

Labrum: Scalloping along the superior labral base for example on
image 16 of series 13, raising suspicion for a shallow broad labral
tear.

Bursa: No regional bursitis.

Muscles and tendons

Muscles and tendons:  Unremarkable

Other findings

Miscellaneous: Sigmoid colon diverticulosis. Cervical nabothian
cysts. Suspect IUD along the endometrium.

--------------------------------

MRI ARTHROGRAM OF THE LEFT HIP:

Bones: Very minimal spurring of the left femoral head.

Articular cartilage and labrum

Articular cartilage:  Unremarkable

Labrum:  Unremarkable

Bursa: No regional bursitis.

Muscles and tendons

Muscles and tendons:  Unremarkable

Other findings

Miscellaneous:   As above
IMPRESSION: 1. Nonspecific lesion in the right femoral head inferomedially has
high T2 and intermediate T1 signal characteristics. This is most
likely a benign lesion, such as an enchondroma, and does not
communicate with the joint. If the patient has a history of
malignancy with a predilection for bony spread, such as breast
cancer or lung cancer, then further workup by bone scan or PET-CT
might be warranted.
2. Suspected shallow broad labral tear of the right upper acetabular
labrum based on a scalloped appearance of the labral surface.
3. Mild spurring of both femoral heads. Minimal spurring of the
right acetabulum.
4. Sigmoid colon diverticulosis.

ADDENDUM:
Examination was reviewed on [DATE] at [BE] hours.

Left superior labrum is degenerated with fraying along the free edge
and a probable tiny tear of the superior anterior labrum.

Severe right superior labral degeneration with a tear of the
superior anterior labrum.

*** End of Addendum ***
FINDINGS: MRI ARTHROGRAM OF THE RIGHT HIP:

Bones/joints: Minimal spurring of the right femoral head.

In the inferomedial portion of the right femoral head, a 1.4 by
by 0.8 cm lesion with high T2 and intermediate T1 signal
characteristics is present. No contrast extension into this lesion
to suggest a geode connecting to the joint. Statistically this is
most likely to be a benign lesion such as an enchondroma, but the
lesion is technically nonspecific and could take on additional
significance if the patient has a history of malignancy with
predilection for bony spread. No other similar bony lesions are
identified in the bony pelvis or lung visualized lumbar spine.

Normal spherical morphology of the right femoral head. There is a
suggestion of mild spurring of the acetabulum. Pubis unremarkable.
No specific SI joint abnormality is observed. No free fragments are
observed in the right hip joint, although there are some tiny gas
bubbles in the non dependent portion of the joint.

Articular cartilage and labrum

Articular cartilage:  No abnormality observed.

Labrum: Scalloping along the superior labral base for example on
image 16 of series 13, raising suspicion for a shallow broad labral
tear.

Bursa: No regional bursitis.

Muscles and tendons

Muscles and tendons:  Unremarkable

Other findings

Miscellaneous: Sigmoid colon diverticulosis. Cervical nabothian
cysts. Suspect IUD along the endometrium.

--------------------------------

MRI ARTHROGRAM OF THE LEFT HIP:

Bones: Very minimal spurring of the left femoral head.

Articular cartilage and labrum

Articular cartilage:  Unremarkable

Labrum:  Unremarkable

Bursa: No regional bursitis.

Muscles and tendons

Muscles and tendons:  Unremarkable

Other findings

Miscellaneous:   As above
IMPRESSION: 1. Nonspecific lesion in the right femoral head inferomedially has
high T2 and intermediate T1 signal characteristics. This is most
likely a benign lesion, such as an enchondroma, and does not
communicate with the joint. If the patient has a history of
malignancy with a predilection for bony spread, such as breast
cancer or lung cancer, then further workup by bone scan or PET-CT
might be warranted.
2. Suspected shallow broad labral tear of the right upper acetabular
labrum based on a scalloped appearance of the labral surface.
3. Mild spurring of both femoral heads. Minimal spurring of the
right acetabulum.
4. Sigmoid colon diverticulosis.

## 2020-02-01 IMAGING — RF DG FLUORO GUIDE NDL PLC/BX
1 series · 1 of 1 positions shown · non-contrast
Comparison: None.

CLINICAL DATA: Bilateral hip pain.

EXAM:
RIGHT HIP ARTHROGRAM UNDER FLUOROSCOPY

[Series 1: cp_standard · 0.26mm/px · 1 of 1 slices shown]
[im 1/1]
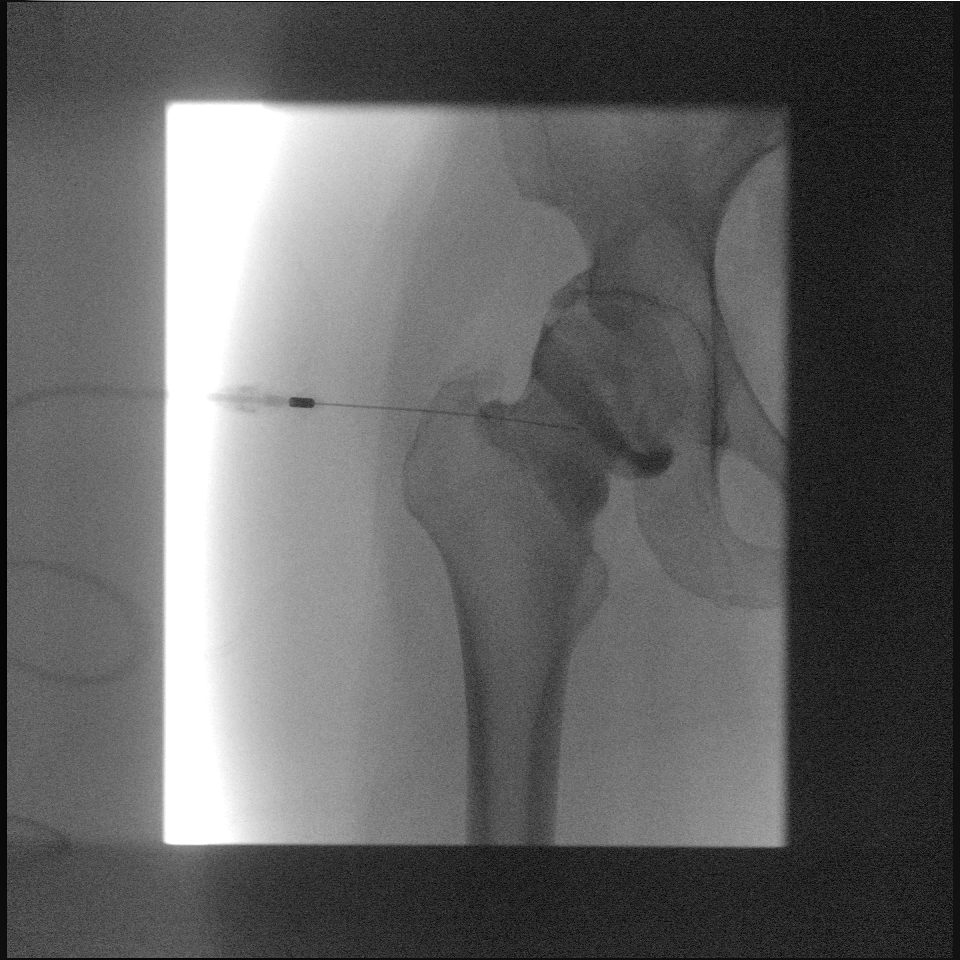

[1 of 1 positions shown; findings below may reference images not displayed]

FLUOROSCOPY TIME:  Fluoroscopy Time:  0.1 minute

Radiation Exposure Index (if provided by the fluoroscopic device):
0.1 mGy

Number of Acquired Spot Images: 0

PROCEDURE:
The risks and benefits of the procedure were discussed with the
patient, and written informed consent was obtained. The patient
stated no history of allergy to contrast media. A formal timeout
procedure was performed with the patient according to departmental
protocol.

The patient was placed supine on the fluoroscopy table and the right
hip joint was identified under fluoroscopy. The skin overlying the
joint was subsequently cleaned with Chloraprep and a sterile drape
was placed over the area of interest. 5 ml 1% Lidocaine was used to
anesthetize the skin around the needle insertion site.

A 22 gauge spinal needle was inserted into the joint under
fluoroscopy. Position was confirmed with injection of less than 1ml
of Omnipaque 180 under fluoroscopy.

12 ml of gadolinium mixture (0.05 mL of Gadavist mixed with 10 mL
sterile saline and 10 mL Omnipaque 180) was injected into the joint.

The needle was removed and hemostasis was achieved. The patient was
subsequently transferred to MRI for imaging.
IMPRESSION: Successful right hip arthrogram under fluoroscopic guidance.

## 2020-02-01 IMAGING — RF DG FLUORO GUIDE NDL PLC/BX
1 series · 1 of 1 positions shown · non-contrast
Comparison: None.

CLINICAL DATA: Left hip pain

EXAM:
LEFT HIP ARTHROGRAM UNDER FLUOROSCOPY

[Series 2: cp_standard · 0.26mm/px · 1 of 1 slices shown]
[im 1/1]
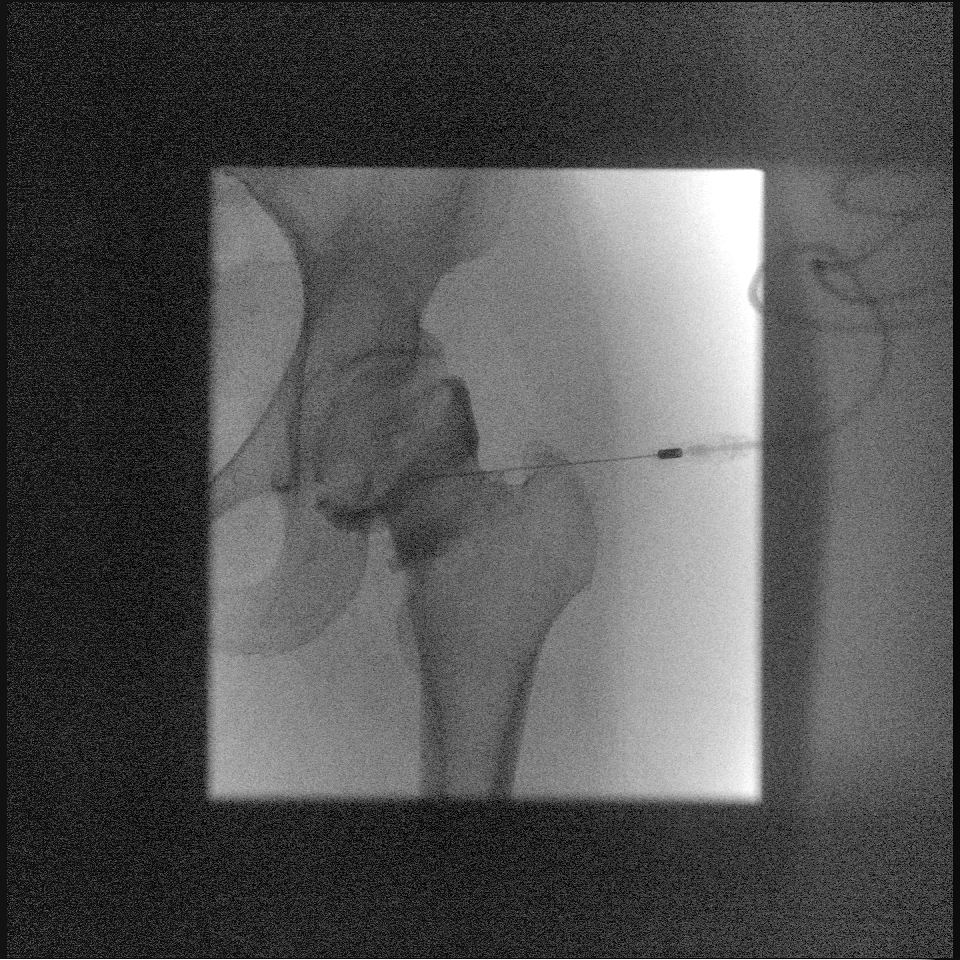

[1 of 1 positions shown; findings below may reference images not displayed]

FLUOROSCOPY TIME:  Fluoroscopy Time: 0 min

Radiation Exposure Index (if provided by the fluoroscopic device):
<0 mGy

Number of Acquired Spot Images: 0

PROCEDURE:
The risks and benefits of the procedure were discussed with the
patient, and written informed consent was obtained. The patient
stated no history of allergy to contrast media. A formal timeout
procedure was performed with the patient according to departmental
protocol.

The patient was placed supine on the fluoroscopy table and the left
hip joint was identified under fluoroscopy. The skin overlying the
joint was subsequently cleaned with Chloraprep and a sterile drape
was placed over the area of interest. 5 ml 1% Lidocaine was used to
anesthetize the skin around the needle insertion site.

A 22 gauge spinal needle was inserted into the joint under
fluoroscopy. Position was confirmed with injection of less than 1ml
of Omnipaque 180 under fluoroscopy.

12 ml of gadolinium mixture (0.05 mL of Gadavist mixed with 10 mL
sterile saline and 10 mL Omnipaque 180) was injected into the joint.

The needle was removed and hemostasis was achieved. The patient was
subsequently transferred to MRI for imaging.
IMPRESSION: Successful left hip arthrogram under fluoroscopic guidance.

## 2020-02-01 MED ORDER — LIDOCAINE HCL (PF) 1 % IJ SOLN
10.0000 mL | Freq: Once | INTRAMUSCULAR | Status: AC
  Administered 2020-02-01: 10 mL
  Filled 2020-02-01: qty 10

## 2020-02-01 MED ORDER — SODIUM CHLORIDE (PF) 0.9 % IJ SOLN
7.0000 mL | Freq: Once | INTRAMUSCULAR | Status: AC
  Administered 2020-02-01: 7 mL

## 2020-02-01 MED ORDER — SODIUM CHLORIDE (PF) 0.9 % IJ SOLN: 7.0000 mL | Freq: Once | INTRAMUSCULAR | Status: DC

## 2020-02-01 MED ORDER — GADOBENATE DIMEGLUMINE 529 MG/ML IV SOLN
0.0500 mL | Freq: Once | INTRAVENOUS | Status: AC
  Administered 2020-02-01: 0.05 mL via INTRA_ARTICULAR

## 2020-02-01 MED ORDER — IOHEXOL 180 MG/ML  SOLN
13.0000 mL | Freq: Once | INTRAMUSCULAR | Status: AC
  Administered 2020-02-01: 13 mL

## 2020-02-01 MED ORDER — GADOBUTROL 1 MMOL/ML IV SOLN
0.0500 mL | Freq: Once | INTRAVENOUS | Status: AC
  Administered 2020-02-01: 0.05 mL

## 2020-02-03 DIAGNOSIS — Z1231 Encounter for screening mammogram for malignant neoplasm of breast: Secondary | ICD-10-CM | POA: Diagnosis not present

## 2020-02-03 DIAGNOSIS — Z01419 Encounter for gynecological examination (general) (routine) without abnormal findings: Secondary | ICD-10-CM | POA: Diagnosis not present

## 2020-02-03 DIAGNOSIS — Z975 Presence of (intrauterine) contraceptive device: Secondary | ICD-10-CM | POA: Diagnosis not present

## 2020-02-03 DIAGNOSIS — L292 Pruritus vulvae: Secondary | ICD-10-CM | POA: Diagnosis not present

## 2020-02-15 DIAGNOSIS — S73192A Other sprain of left hip, initial encounter: Secondary | ICD-10-CM | POA: Diagnosis not present

## 2020-02-15 DIAGNOSIS — M25851 Other specified joint disorders, right hip: Secondary | ICD-10-CM | POA: Diagnosis not present

## 2020-02-15 DIAGNOSIS — S73191A Other sprain of right hip, initial encounter: Secondary | ICD-10-CM | POA: Diagnosis not present

## 2020-02-15 DIAGNOSIS — M25852 Other specified joint disorders, left hip: Secondary | ICD-10-CM | POA: Diagnosis not present

## 2020-04-11 DIAGNOSIS — Z01818 Encounter for other preprocedural examination: Secondary | ICD-10-CM | POA: Diagnosis not present

## 2020-04-11 DIAGNOSIS — S73192D Other sprain of left hip, subsequent encounter: Secondary | ICD-10-CM | POA: Diagnosis not present

## 2020-04-17 HISTORY — PX: HIP SURGERY: SHX245

## 2020-04-18 DIAGNOSIS — S73191A Other sprain of right hip, initial encounter: Secondary | ICD-10-CM | POA: Diagnosis not present

## 2020-04-18 DIAGNOSIS — M25552 Pain in left hip: Secondary | ICD-10-CM | POA: Diagnosis not present

## 2020-04-18 DIAGNOSIS — S73192A Other sprain of left hip, initial encounter: Secondary | ICD-10-CM | POA: Diagnosis not present

## 2020-04-18 DIAGNOSIS — M25852 Other specified joint disorders, left hip: Secondary | ICD-10-CM | POA: Diagnosis not present

## 2020-04-18 DIAGNOSIS — G8918 Other acute postprocedural pain: Secondary | ICD-10-CM | POA: Diagnosis not present

## 2020-04-27 DIAGNOSIS — J4520 Mild intermittent asthma, uncomplicated: Secondary | ICD-10-CM | POA: Diagnosis not present

## 2020-04-27 DIAGNOSIS — J4540 Moderate persistent asthma, uncomplicated: Secondary | ICD-10-CM | POA: Diagnosis not present

## 2020-05-02 ENCOUNTER — Other Ambulatory Visit (HOSPITAL_COMMUNITY): Payer: Self-pay

## 2020-05-03 ENCOUNTER — Other Ambulatory Visit (HOSPITAL_COMMUNITY): Payer: Self-pay

## 2020-05-03 MED ORDER — BUDESONIDE-FORMOTEROL FUMARATE 80-4.5 MCG/ACT IN AERO
INHALATION_SPRAY | RESPIRATORY_TRACT | 4 refills | Status: DC
  Filled 2020-05-03: qty 10.2, 30d supply, fill #0

## 2020-05-04 ENCOUNTER — Other Ambulatory Visit (HOSPITAL_COMMUNITY): Payer: Self-pay

## 2020-05-04 DIAGNOSIS — Z131 Encounter for screening for diabetes mellitus: Secondary | ICD-10-CM | POA: Diagnosis not present

## 2020-05-04 DIAGNOSIS — S73191D Other sprain of right hip, subsequent encounter: Secondary | ICD-10-CM | POA: Diagnosis not present

## 2020-05-04 DIAGNOSIS — G43909 Migraine, unspecified, not intractable, without status migrainosus: Secondary | ICD-10-CM | POA: Diagnosis not present

## 2020-05-04 DIAGNOSIS — Z9889 Other specified postprocedural states: Secondary | ICD-10-CM | POA: Diagnosis not present

## 2020-05-04 DIAGNOSIS — Z0000 Encounter for general adult medical examination without abnormal findings: Secondary | ICD-10-CM | POA: Diagnosis not present

## 2020-05-04 DIAGNOSIS — Z13220 Encounter for screening for lipoid disorders: Secondary | ICD-10-CM | POA: Diagnosis not present

## 2020-05-05 ENCOUNTER — Other Ambulatory Visit (HOSPITAL_COMMUNITY): Payer: Self-pay

## 2020-05-06 ENCOUNTER — Other Ambulatory Visit (HOSPITAL_COMMUNITY): Payer: Self-pay

## 2020-05-06 DIAGNOSIS — M25552 Pain in left hip: Secondary | ICD-10-CM | POA: Diagnosis not present

## 2020-05-06 DIAGNOSIS — M6281 Muscle weakness (generalized): Secondary | ICD-10-CM | POA: Diagnosis not present

## 2020-05-06 DIAGNOSIS — Z7409 Other reduced mobility: Secondary | ICD-10-CM | POA: Diagnosis not present

## 2020-05-06 MED ORDER — ALBUTEROL SULFATE HFA 108 (90 BASE) MCG/ACT IN AERS
INHALATION_SPRAY | RESPIRATORY_TRACT | 5 refills | Status: DC
Start: 2020-05-06 — End: 2021-01-10
  Filled 2020-05-06: qty 18, 25d supply, fill #0
  Filled 2020-10-13: qty 18, 25d supply, fill #1

## 2020-05-07 ENCOUNTER — Other Ambulatory Visit (HOSPITAL_COMMUNITY): Payer: Self-pay

## 2020-05-09 ENCOUNTER — Other Ambulatory Visit (HOSPITAL_COMMUNITY): Payer: Self-pay

## 2020-05-10 ENCOUNTER — Other Ambulatory Visit (HOSPITAL_COMMUNITY): Payer: Self-pay

## 2020-05-11 ENCOUNTER — Other Ambulatory Visit (HOSPITAL_COMMUNITY): Payer: Self-pay

## 2020-05-12 ENCOUNTER — Other Ambulatory Visit (HOSPITAL_COMMUNITY): Payer: Self-pay

## 2020-05-18 DIAGNOSIS — M25552 Pain in left hip: Secondary | ICD-10-CM | POA: Diagnosis not present

## 2020-05-18 DIAGNOSIS — M6281 Muscle weakness (generalized): Secondary | ICD-10-CM | POA: Diagnosis not present

## 2020-05-18 DIAGNOSIS — Z7409 Other reduced mobility: Secondary | ICD-10-CM | POA: Diagnosis not present

## 2020-05-21 DIAGNOSIS — B029 Zoster without complications: Secondary | ICD-10-CM | POA: Diagnosis not present

## 2020-05-26 DIAGNOSIS — M25552 Pain in left hip: Secondary | ICD-10-CM | POA: Diagnosis not present

## 2020-05-26 DIAGNOSIS — M6281 Muscle weakness (generalized): Secondary | ICD-10-CM | POA: Diagnosis not present

## 2020-05-26 DIAGNOSIS — Z7409 Other reduced mobility: Secondary | ICD-10-CM | POA: Diagnosis not present

## 2020-05-31 DIAGNOSIS — Z7409 Other reduced mobility: Secondary | ICD-10-CM | POA: Diagnosis not present

## 2020-05-31 DIAGNOSIS — M25552 Pain in left hip: Secondary | ICD-10-CM | POA: Diagnosis not present

## 2020-05-31 DIAGNOSIS — M6281 Muscle weakness (generalized): Secondary | ICD-10-CM | POA: Diagnosis not present

## 2020-06-08 DIAGNOSIS — Z7409 Other reduced mobility: Secondary | ICD-10-CM | POA: Diagnosis not present

## 2020-06-08 DIAGNOSIS — M25552 Pain in left hip: Secondary | ICD-10-CM | POA: Diagnosis not present

## 2020-06-08 DIAGNOSIS — M6281 Muscle weakness (generalized): Secondary | ICD-10-CM | POA: Diagnosis not present

## 2020-06-14 DIAGNOSIS — M6281 Muscle weakness (generalized): Secondary | ICD-10-CM | POA: Diagnosis not present

## 2020-06-14 DIAGNOSIS — M25552 Pain in left hip: Secondary | ICD-10-CM | POA: Diagnosis not present

## 2020-06-14 DIAGNOSIS — Z7409 Other reduced mobility: Secondary | ICD-10-CM | POA: Diagnosis not present

## 2020-06-21 DIAGNOSIS — Z7409 Other reduced mobility: Secondary | ICD-10-CM | POA: Diagnosis not present

## 2020-06-21 DIAGNOSIS — M6281 Muscle weakness (generalized): Secondary | ICD-10-CM | POA: Diagnosis not present

## 2020-06-21 DIAGNOSIS — M25552 Pain in left hip: Secondary | ICD-10-CM | POA: Diagnosis not present

## 2020-07-06 DIAGNOSIS — Z7409 Other reduced mobility: Secondary | ICD-10-CM | POA: Diagnosis not present

## 2020-07-06 DIAGNOSIS — M25552 Pain in left hip: Secondary | ICD-10-CM | POA: Diagnosis not present

## 2020-07-06 DIAGNOSIS — M6281 Muscle weakness (generalized): Secondary | ICD-10-CM | POA: Diagnosis not present

## 2020-08-06 DIAGNOSIS — M25551 Pain in right hip: Secondary | ICD-10-CM | POA: Diagnosis not present

## 2020-09-26 ENCOUNTER — Other Ambulatory Visit: Payer: Self-pay | Admitting: Nurse Practitioner

## 2020-09-26 DIAGNOSIS — Z1231 Encounter for screening mammogram for malignant neoplasm of breast: Secondary | ICD-10-CM

## 2020-10-13 ENCOUNTER — Other Ambulatory Visit (HOSPITAL_COMMUNITY): Payer: Self-pay

## 2020-10-18 ENCOUNTER — Other Ambulatory Visit: Payer: Self-pay

## 2020-10-18 ENCOUNTER — Ambulatory Visit
Admission: RE | Admit: 2020-10-18 | Discharge: 2020-10-18 | Disposition: A | Discharge status: Home / Self Care | Payer: 59 | Source: Ambulatory Visit | Attending: Nurse Practitioner | Admitting: Nurse Practitioner

## 2020-10-18 DIAGNOSIS — Z1231 Encounter for screening mammogram for malignant neoplasm of breast: Secondary | ICD-10-CM | POA: Diagnosis not present

## 2020-10-18 IMAGING — MG MM DIGITAL SCREENING BILAT W/ TOMO AND CAD
6 of 10 series · 6 of 30 positions shown · non-contrast
Comparison: Previous exam(s).

CLINICAL DATA: Screening.

EXAM:
DIGITAL SCREENING BILATERAL MAMMOGRAM WITH TOMOSYNTHESIS AND CAD
TECHNIQUE: Bilateral screening digital craniocaudal and mediolateral oblique
mammograms were obtained. Bilateral screening digital breast
tomosynthesis was performed. The images were evaluated with
computer-aided detection.

[L MLO synth-2D (1 of 2)]
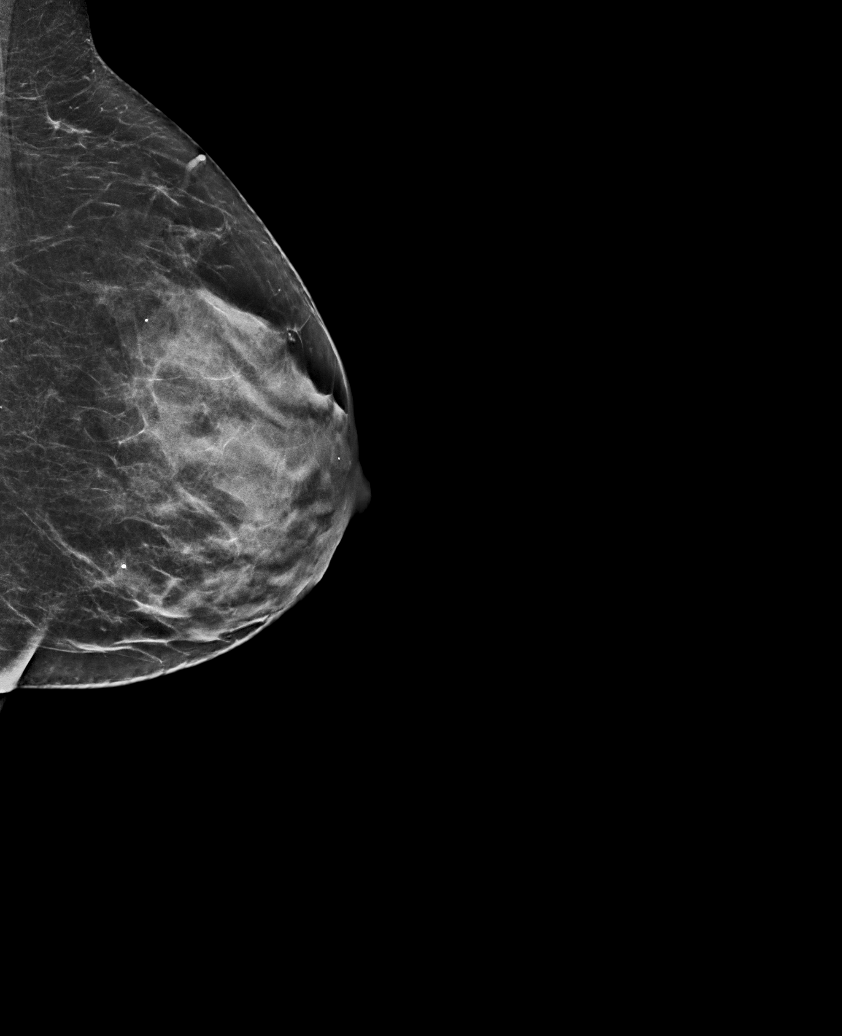

[L MLO synth-2D (2 of 2)]
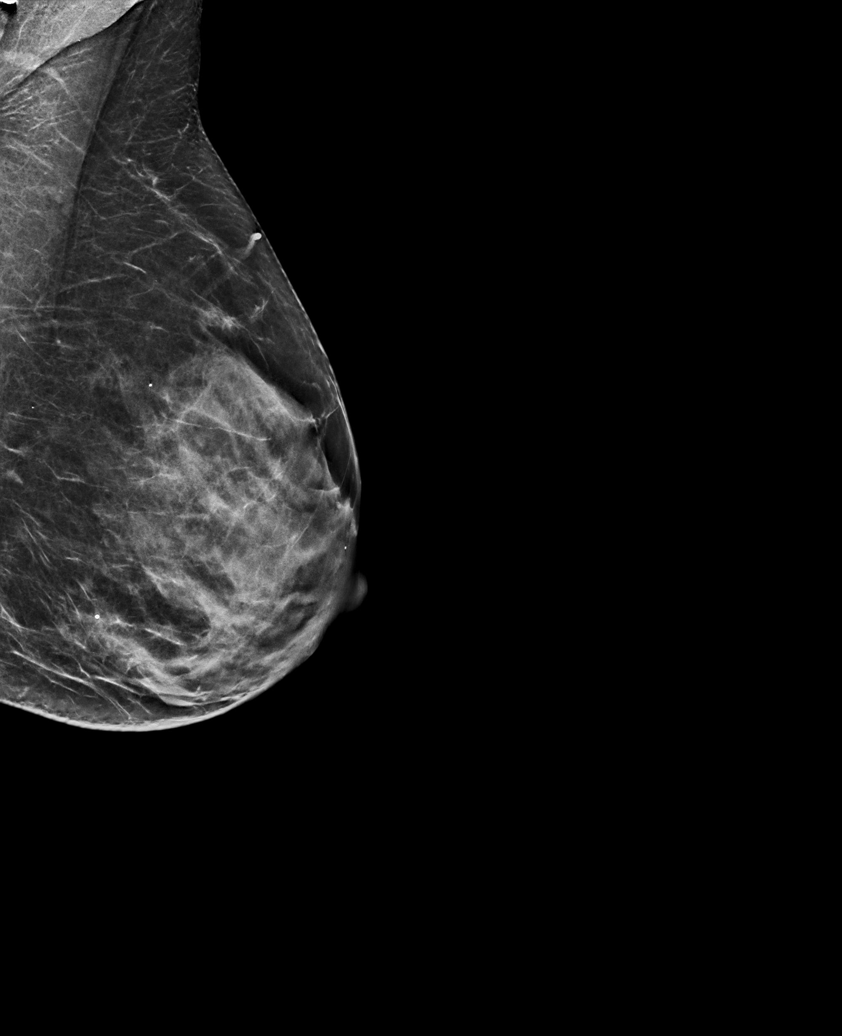

[L CC synth-2D]
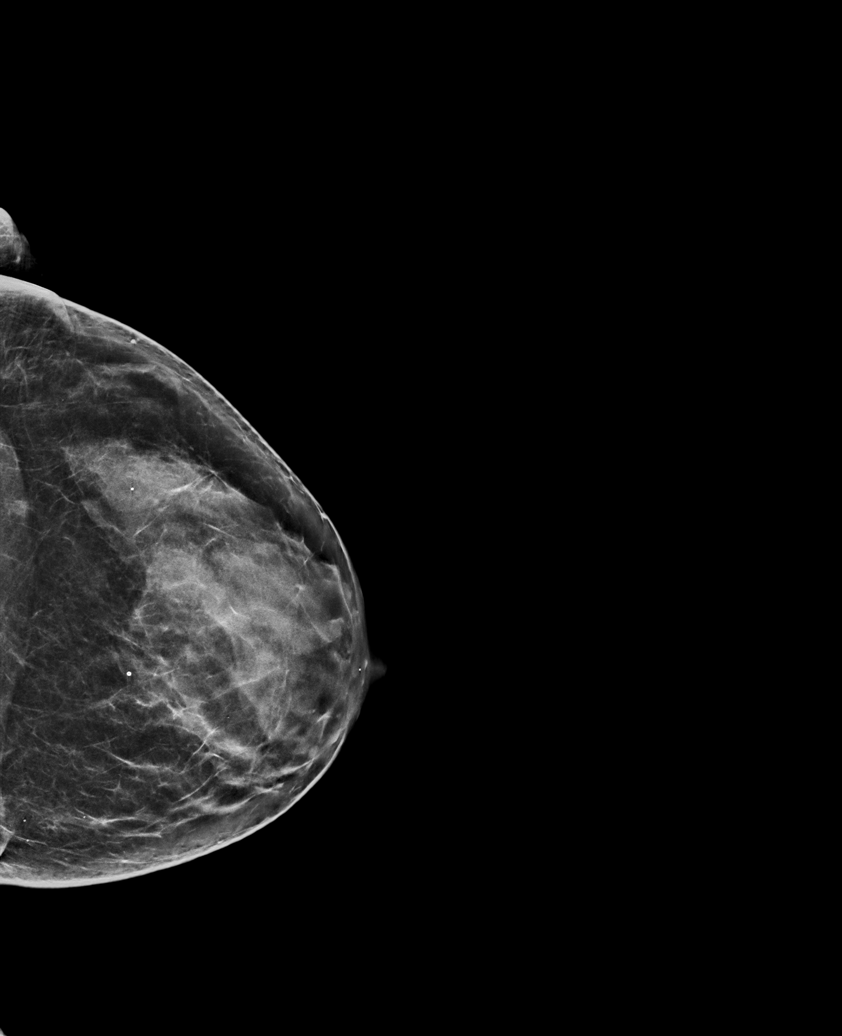

[R CC synth-2D]
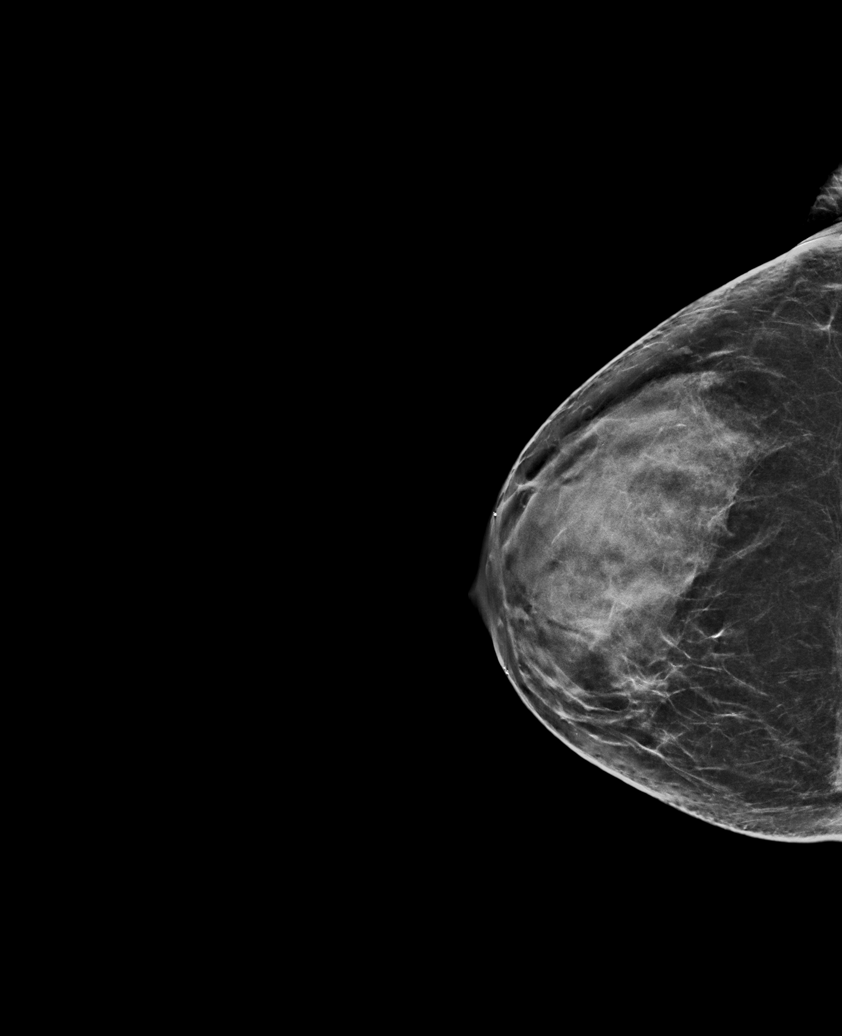

[R MLO synth-2D]
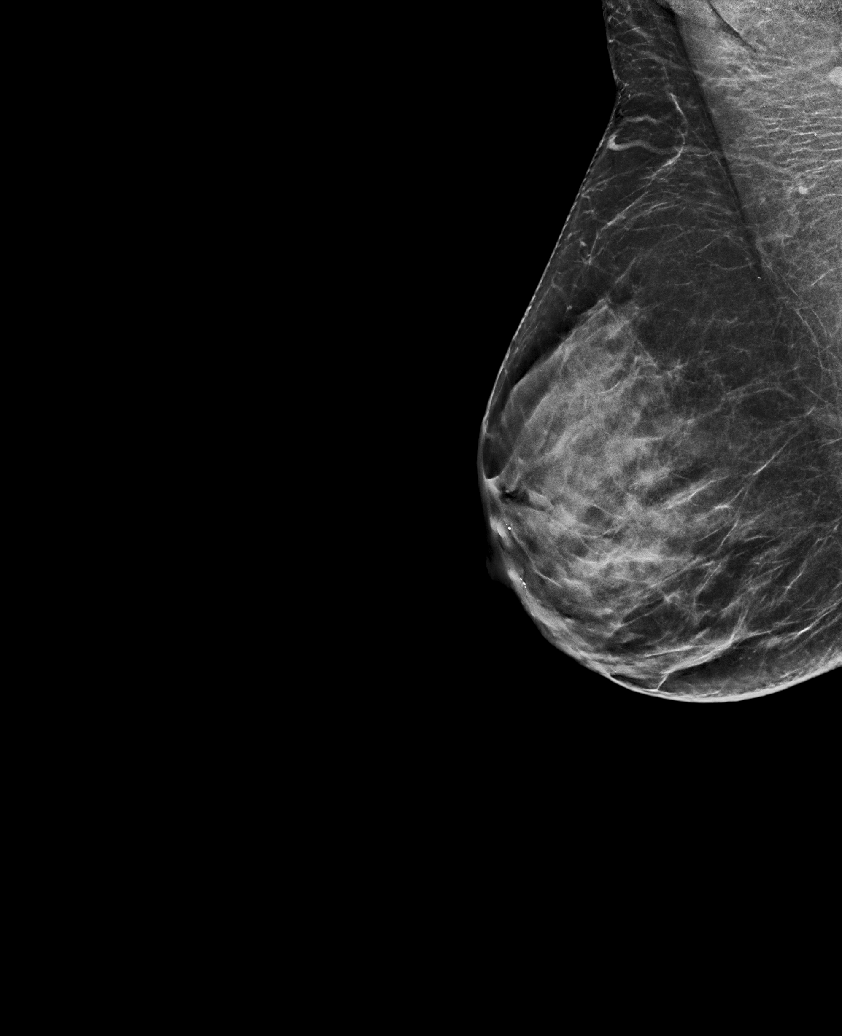

[L CC tomo · tomo slice 33/66.0]
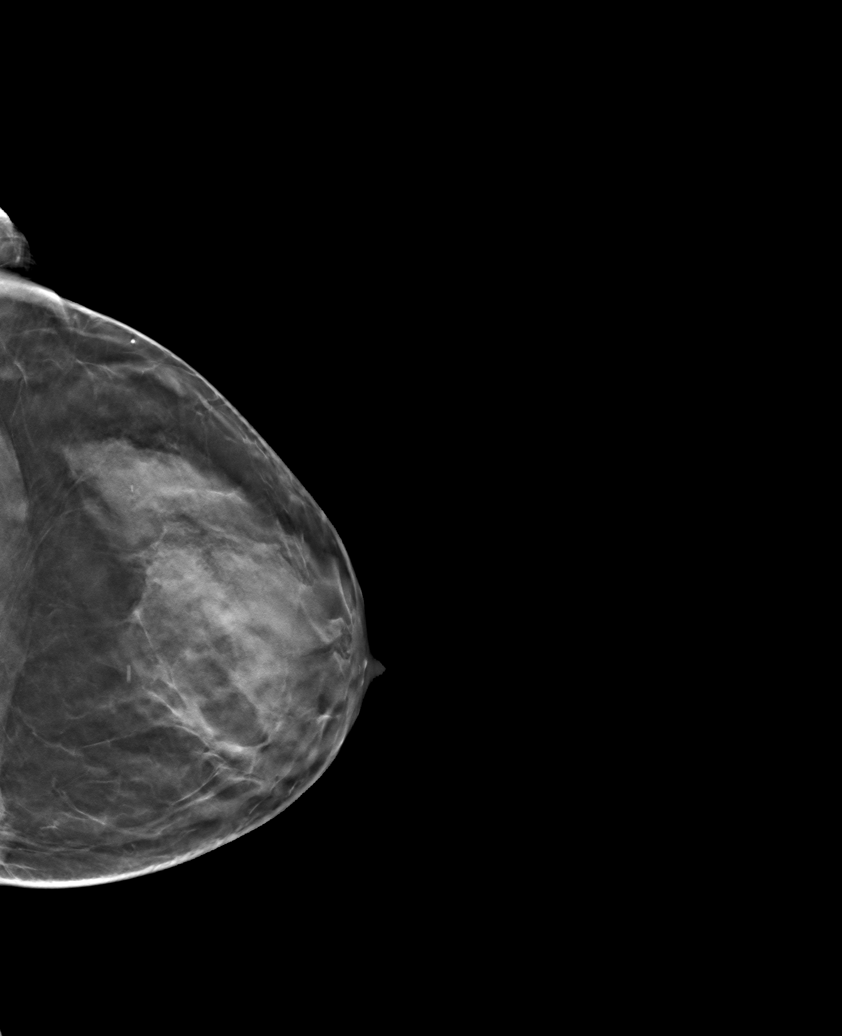

[6 of 30 positions shown; findings below may reference images not displayed]

ACR Breast Density Category c: The breast tissue is heterogeneously
dense, which may obscure small masses.
FINDINGS: In the left breast, possible asymmetries warrant further evaluation.
In the right breast, no findings suspicious for malignancy.
IMPRESSION: Further evaluation is suggested for possible asymmetries in the left
breast.

RECOMMENDATION:
Diagnostic mammogram and possibly ultrasound of the left breast.
(Code:[68])

The patient will be contacted regarding the findings, and additional
imaging will be scheduled.

BI-RADS CATEGORY  0: Incomplete. Need additional imaging evaluation
and/or prior mammograms for comparison.

## 2020-10-18 MED FILL — Meloxicam Tab 15 MG: ORAL | 30 days supply | Qty: 30 | Fill #0 | Status: AC

## 2020-10-19 ENCOUNTER — Other Ambulatory Visit (HOSPITAL_COMMUNITY): Payer: Self-pay

## 2020-10-20 ENCOUNTER — Other Ambulatory Visit (HOSPITAL_COMMUNITY): Payer: Self-pay

## 2020-10-20 MED ORDER — ALBUTEROL SULFATE HFA 108 (90 BASE) MCG/ACT IN AERS: INHALATION_SPRAY | RESPIRATORY_TRACT | 5 refills | Status: DC

## 2020-10-24 ENCOUNTER — Other Ambulatory Visit: Payer: Self-pay | Admitting: Nurse Practitioner

## 2020-10-25 DIAGNOSIS — Z23 Encounter for immunization: Secondary | ICD-10-CM | POA: Diagnosis not present

## 2020-10-25 DIAGNOSIS — J309 Allergic rhinitis, unspecified: Secondary | ICD-10-CM | POA: Diagnosis not present

## 2020-10-25 DIAGNOSIS — J4520 Mild intermittent asthma, uncomplicated: Secondary | ICD-10-CM | POA: Diagnosis not present

## 2020-10-26 ENCOUNTER — Other Ambulatory Visit: Payer: Self-pay | Admitting: Nurse Practitioner

## 2020-10-26 DIAGNOSIS — R928 Other abnormal and inconclusive findings on diagnostic imaging of breast: Secondary | ICD-10-CM

## 2020-10-26 DIAGNOSIS — N6489 Other specified disorders of breast: Secondary | ICD-10-CM

## 2020-11-02 ENCOUNTER — Ambulatory Visit
Admission: RE | Admit: 2020-11-02 | Discharge: 2020-11-02 | Disposition: A | Discharge status: Home / Self Care | Payer: 59 | Source: Ambulatory Visit | Attending: Nurse Practitioner | Admitting: Nurse Practitioner

## 2020-11-02 ENCOUNTER — Other Ambulatory Visit: Payer: Self-pay

## 2020-11-02 DIAGNOSIS — R928 Other abnormal and inconclusive findings on diagnostic imaging of breast: Secondary | ICD-10-CM | POA: Insufficient documentation

## 2020-11-02 DIAGNOSIS — N6489 Other specified disorders of breast: Secondary | ICD-10-CM | POA: Insufficient documentation

## 2020-11-02 DIAGNOSIS — R922 Inconclusive mammogram: Secondary | ICD-10-CM | POA: Diagnosis not present

## 2020-11-02 IMAGING — US US BREAST*L* LIMITED INC AXILLA
1 series · 11 of 11 positions shown · non-contrast
Comparison: Previous exam(s).

CLINICAL DATA: Callback for 2 LEFT breast asymmetries.

EXAM:
DIGITAL DIAGNOSTIC UNILATERAL LEFT MAMMOGRAM WITH TOMOSYNTHESIS AND
CAD; ULTRASOUND LEFT BREAST LIMITED
TECHNIQUE: Left digital diagnostic mammography and breast tomosynthesis was
performed. The images were evaluated with computer-aided detection.;
Targeted ultrasound examination of the left breast was performed.

[Series 1: us breast*left* limited inc axilla · 0.07mm/px · 11 of 11 slices shown]
[im 1/11]
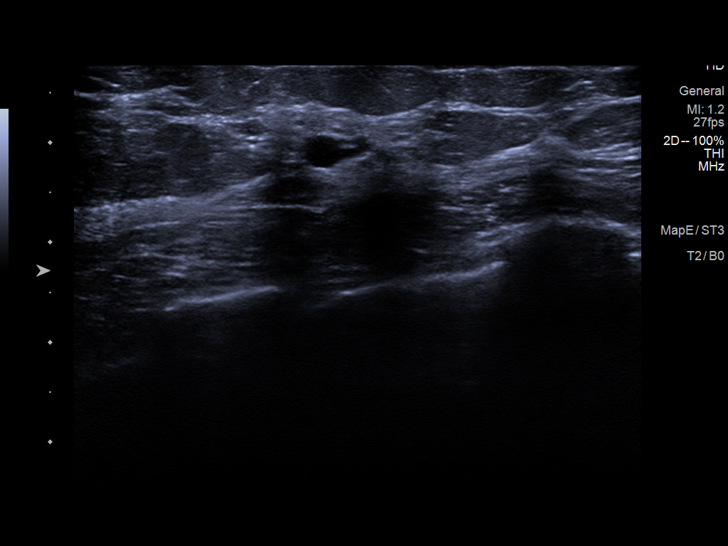
[im 2/11]
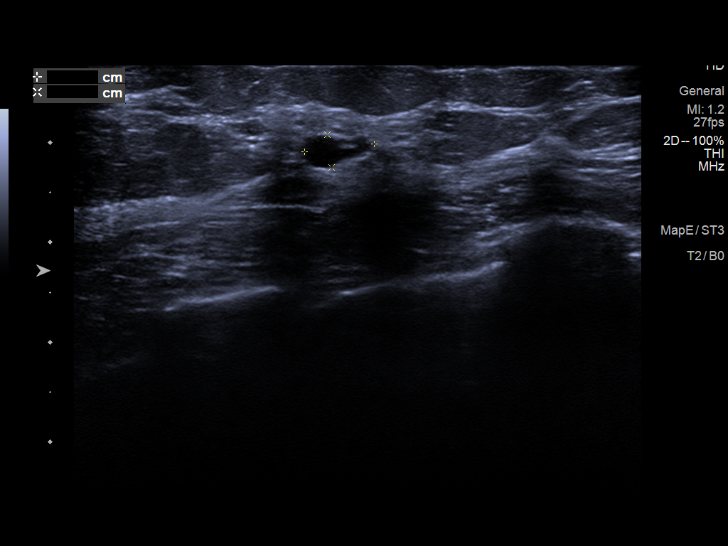
[im 3/11]
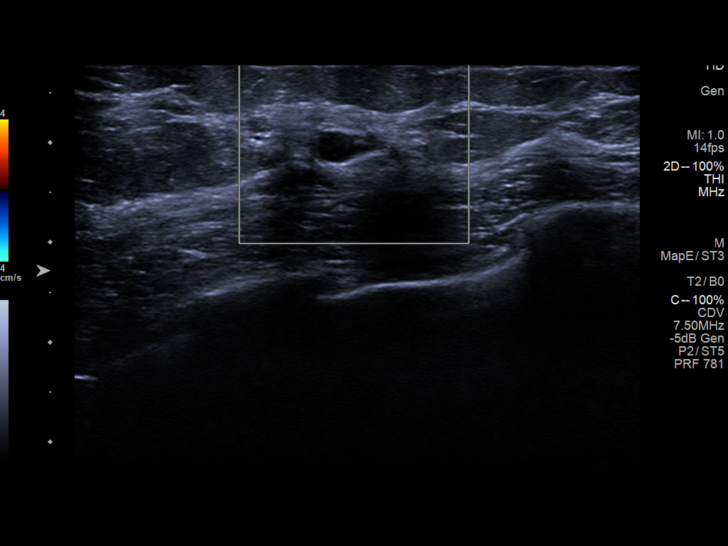
[im 4/11]
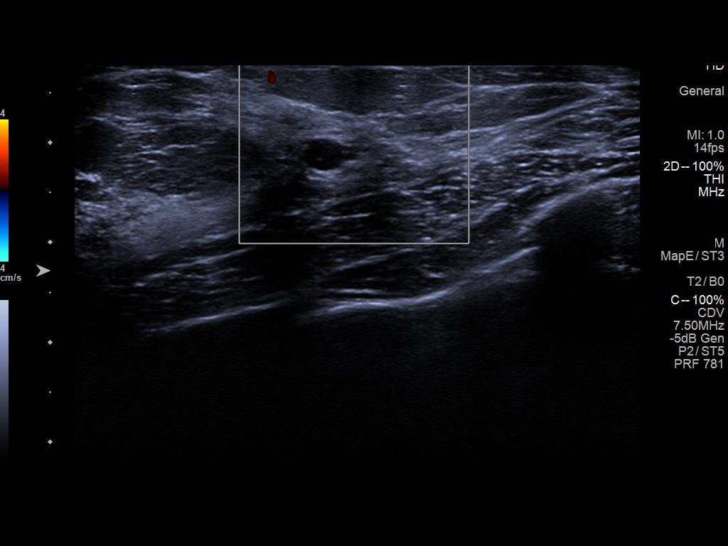
[im 5/11]
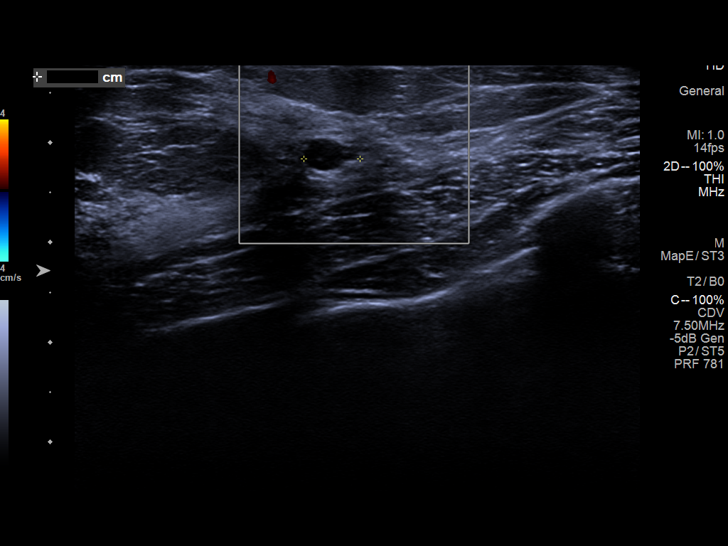
[im 6/11]
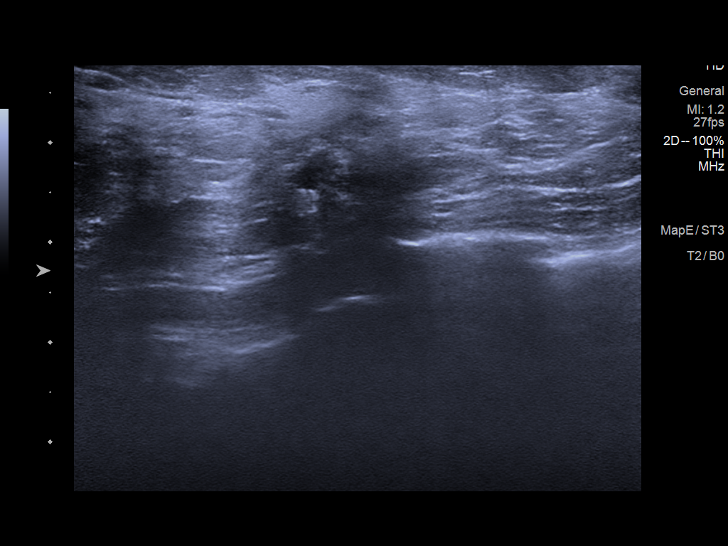
[im 7/11]
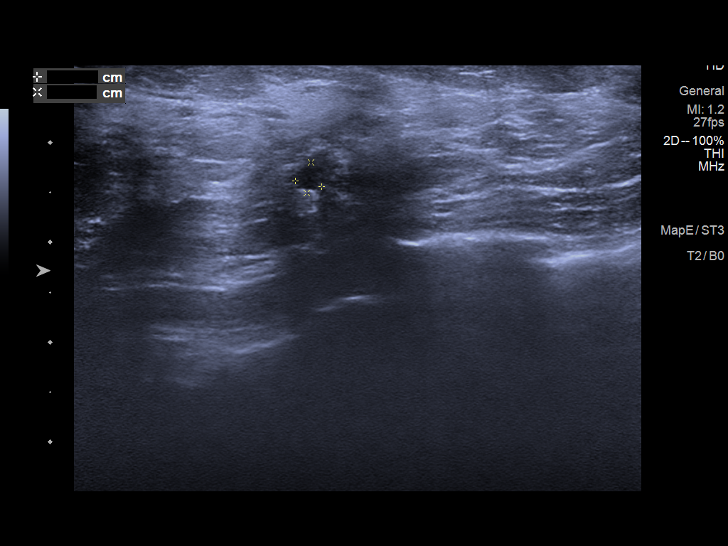
[im 8/11]
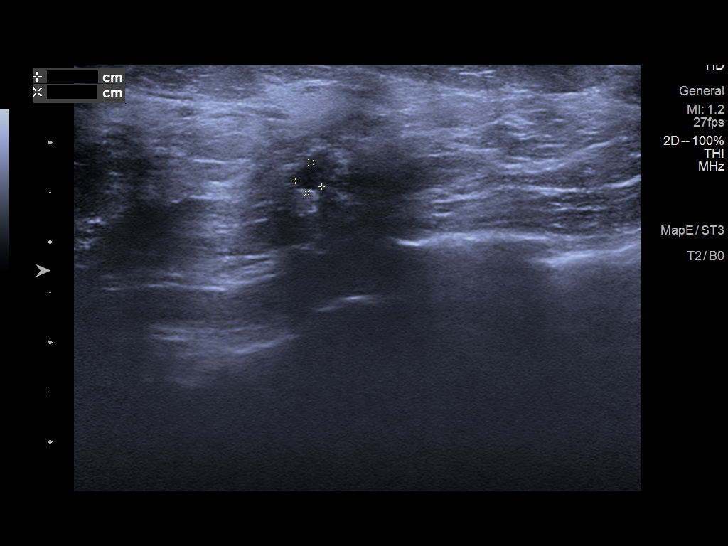
[im 9/11]
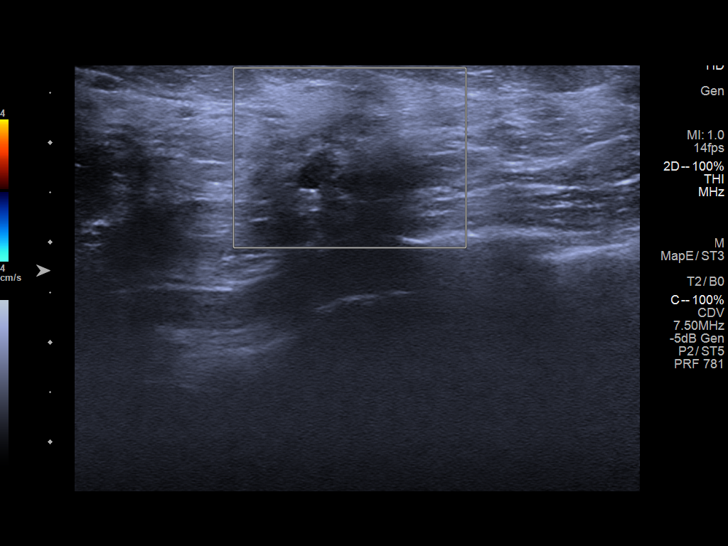
[im 10/11]
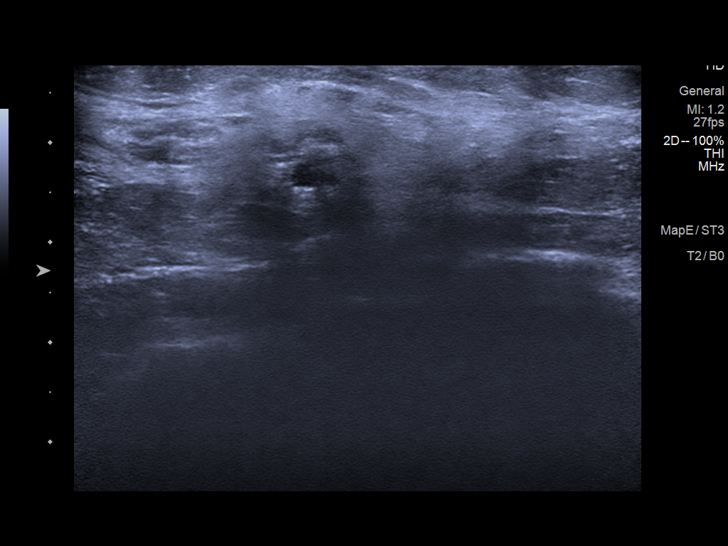
[im 11/11]
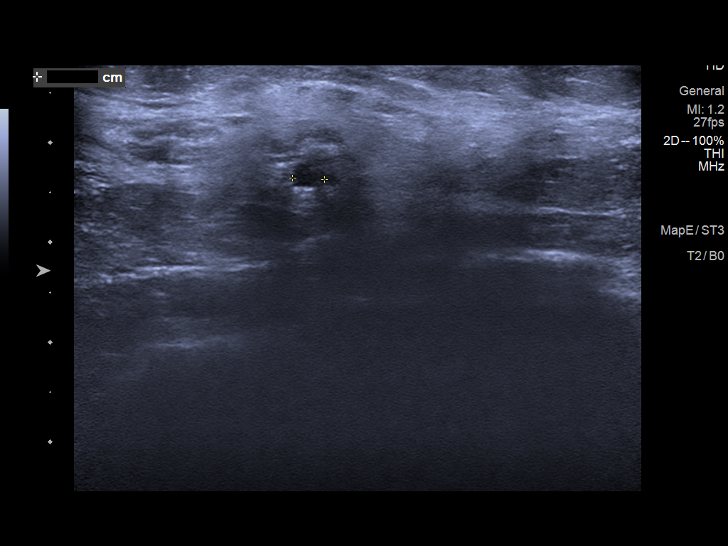

[11 of 11 positions shown; findings below may reference images not displayed]

ACR Breast Density Category c: The breast tissue is heterogeneously
dense, which may obscure small masses.
FINDINGS: The previously described finding seen best on MLO view does not
persist with additional views, consistent with superimposed
fibroglandular tissue. Fibroglandular tissue assumes a configuration
stable in comparison to remote prior mammograms. A residual 7 mm
oval circumscribed mass is noted in the LEFT upper outer breast at
posterior depth. An oval 3 mm circumscribed mass is noted in the
LEFT lower outer breast at posterior depth. No suspicious mass,
microcalcification, or other finding is identified.

On physical exam, no suspicious mass is appreciated.

Targeted ultrasound was performed of the LEFT outer breast. At 2
o'clock 7 cm from the nipple, there is an oval circumscribed
anechoic mass with posterior acoustic enhancement. It measures 7 x 3
x 6 mm and is consistent with a benign simple cyst. This corresponds
to the 7 mm oval mass noted mammographically.

Targeted ultrasound was performed of the LEFT lower outer breast. At
5 o'clock 5 cm from the nipple, there is an oval circumscribed
anechoic mass with posterior acoustic enhancement. It measures 3 x 3
x 3 mm and is consistent with a benign simple cyst. This corresponds
to the 3 mm oval circumscribed mass noted mammographically.
IMPRESSION: No mammographic or sonographic evidence of malignancy at the sites
of screening mammographic concern. There are scattered benign cysts
noted.

RECOMMENDATION:
Screening mammogram in one year.(Code:[44])

I have discussed the findings and recommendations with the patient.
If applicable, a reminder letter will be sent to the patient
regarding the next appointment.

BI-RADS CATEGORY  2: Benign.

## 2020-11-02 IMAGING — MG MM DIGITAL DIAGNOSTIC UNILAT*L* W/ TOMO W/ CAD
6 of 10 series · 6 of 30 positions shown · non-contrast
Comparison: Previous exam(s).

CLINICAL DATA: Callback for 2 LEFT breast asymmetries.

EXAM:
DIGITAL DIAGNOSTIC UNILATERAL LEFT MAMMOGRAM WITH TOMOSYNTHESIS AND
CAD; ULTRASOUND LEFT BREAST LIMITED
TECHNIQUE: Left digital diagnostic mammography and breast tomosynthesis was
performed. The images were evaluated with computer-aided detection.;
Targeted ultrasound examination of the left breast was performed.

[L ML synth-2D (1 of 2)]
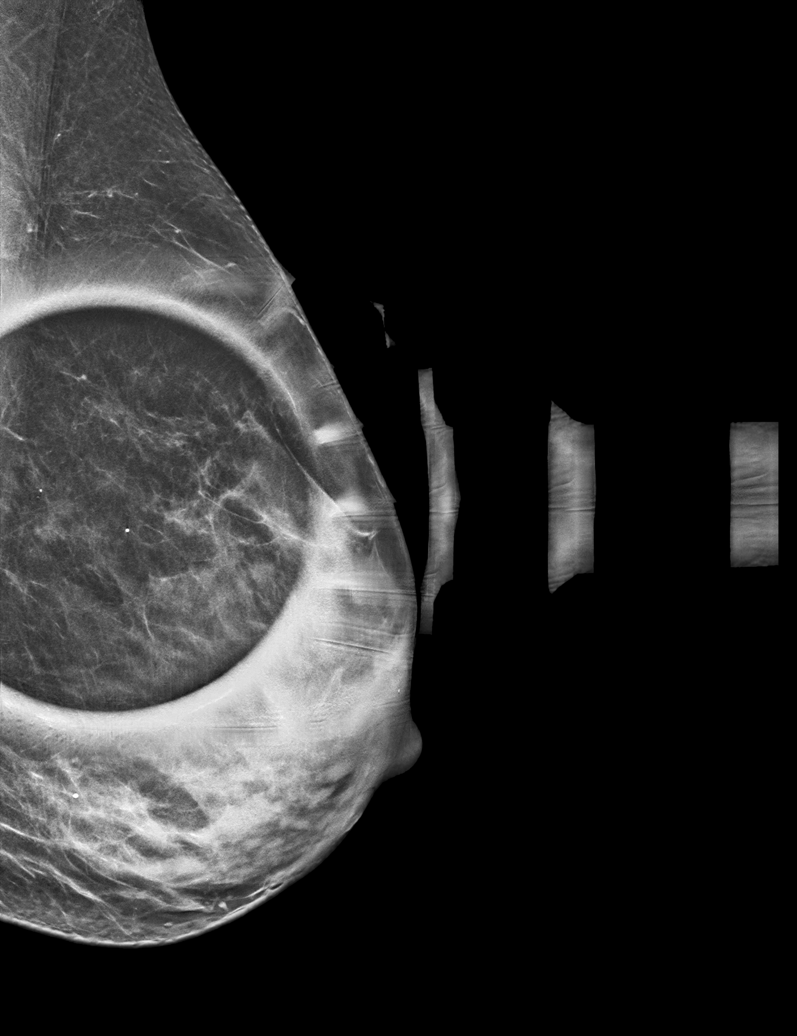

[L MLO synth-2D (1 of 2)]
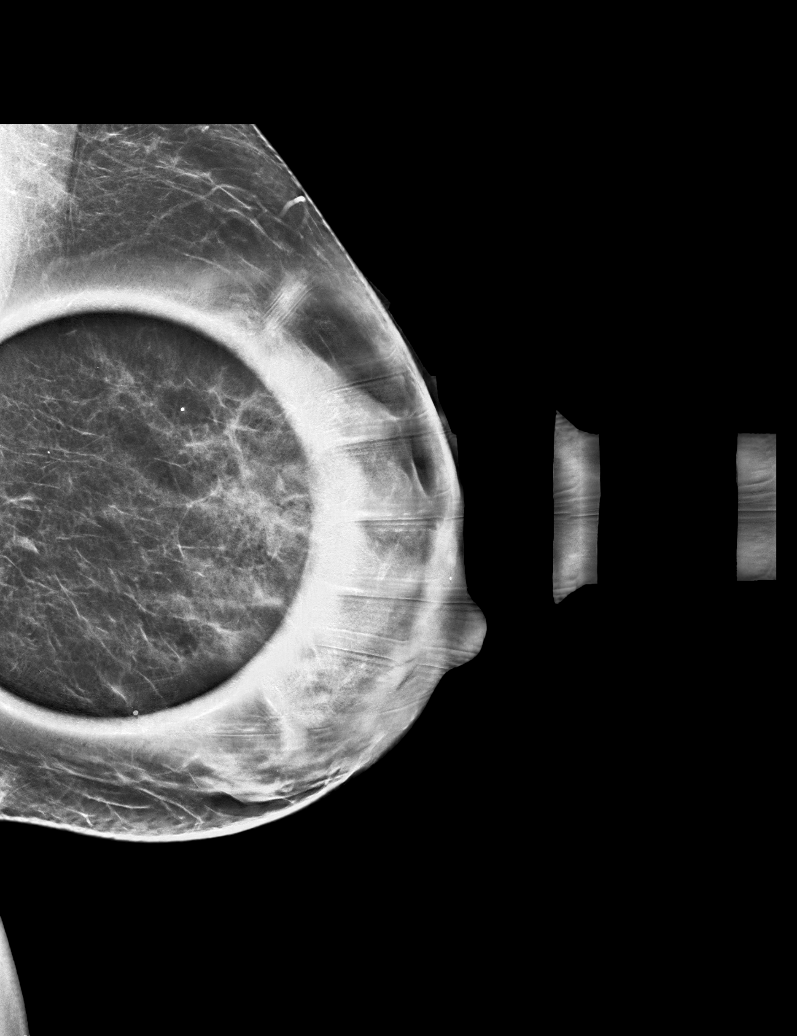

[L CC synth-2D]
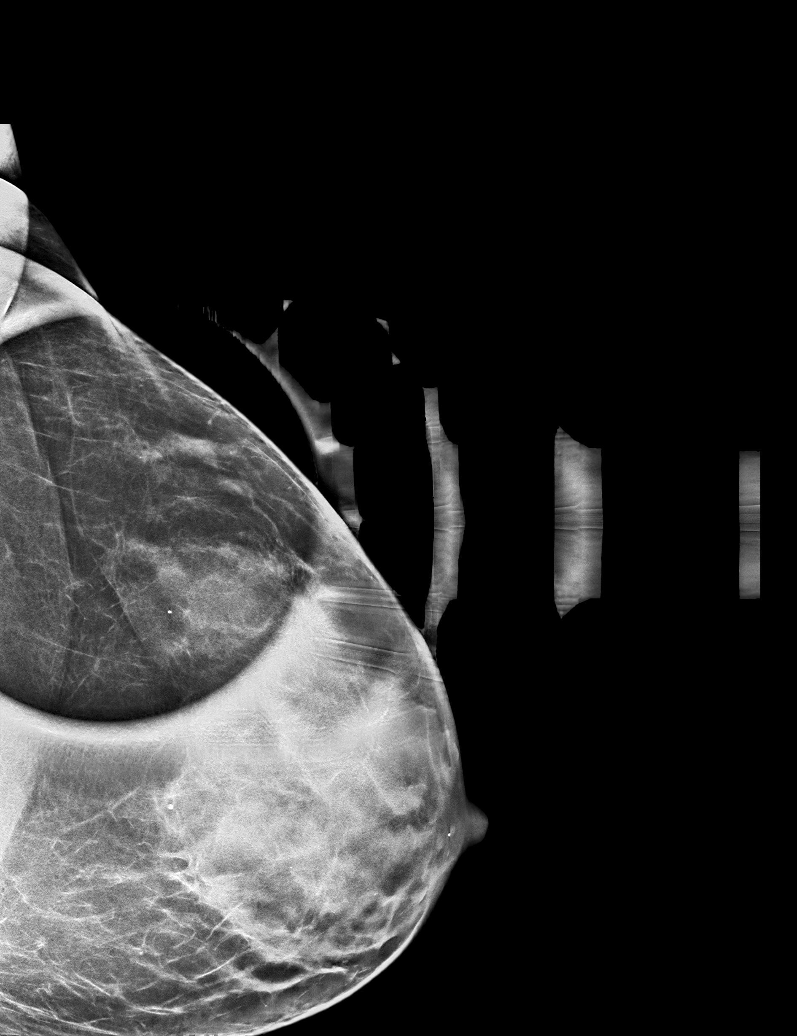

[L ML synth-2D (2 of 2)]
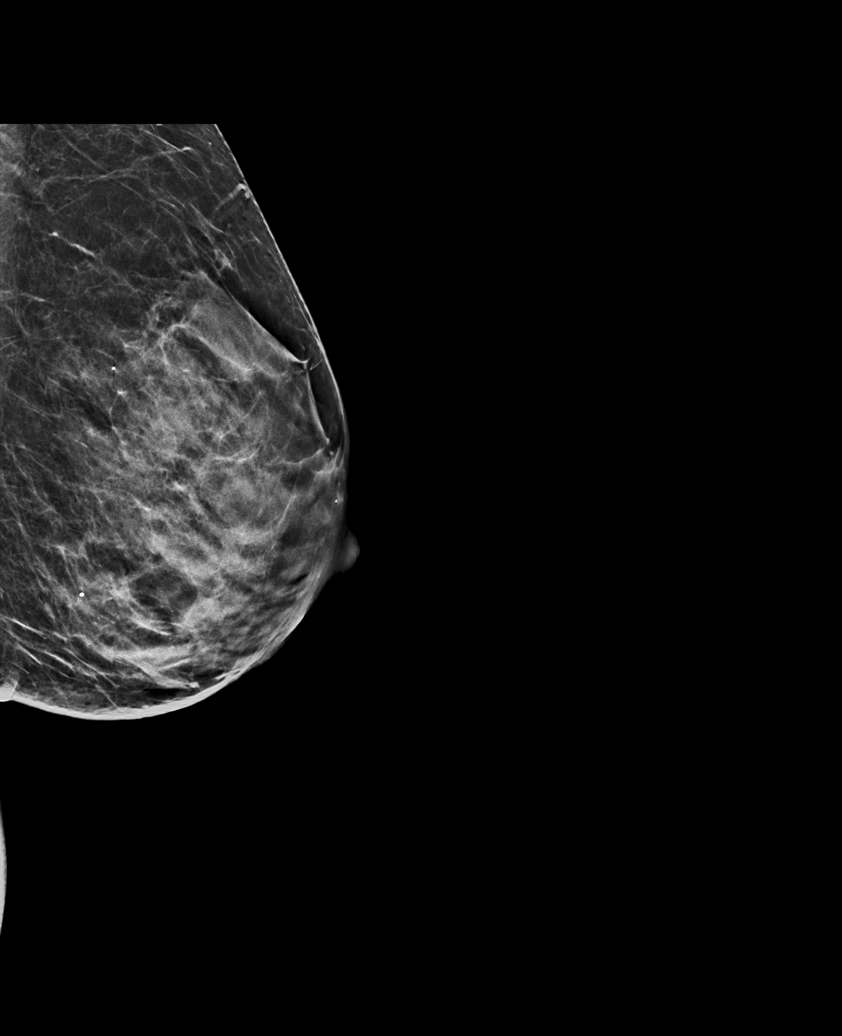

[L MLO synth-2D (2 of 2)]
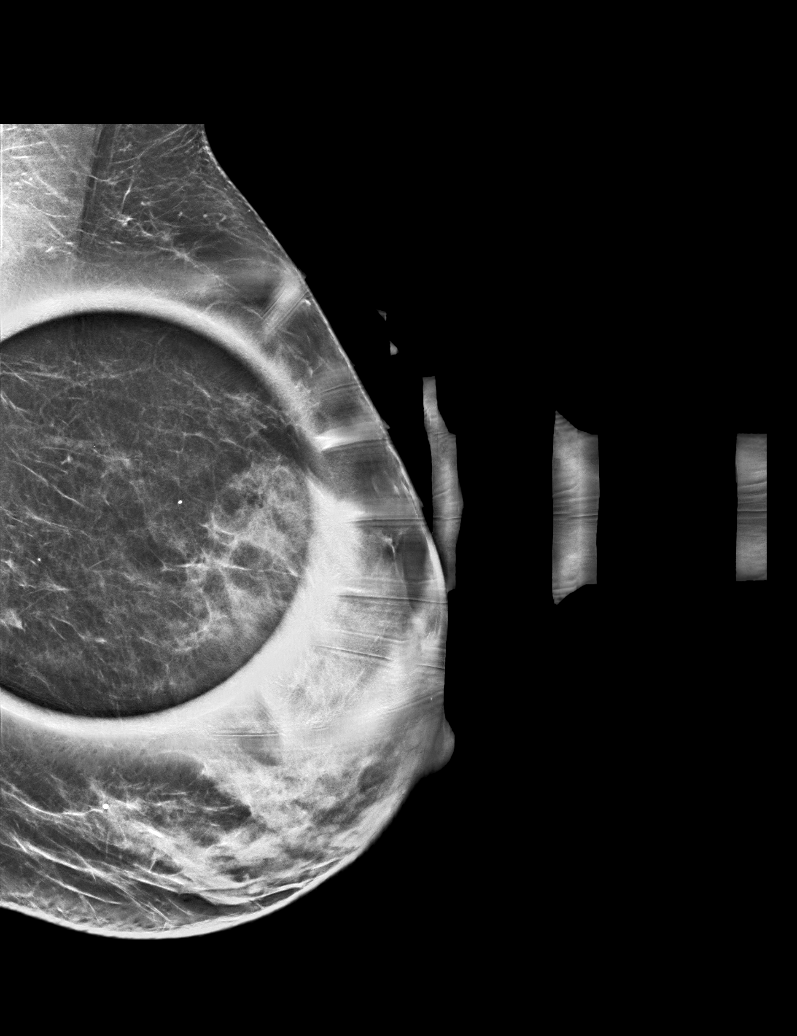

[L CC tomo · tomo slice 30/59.0]
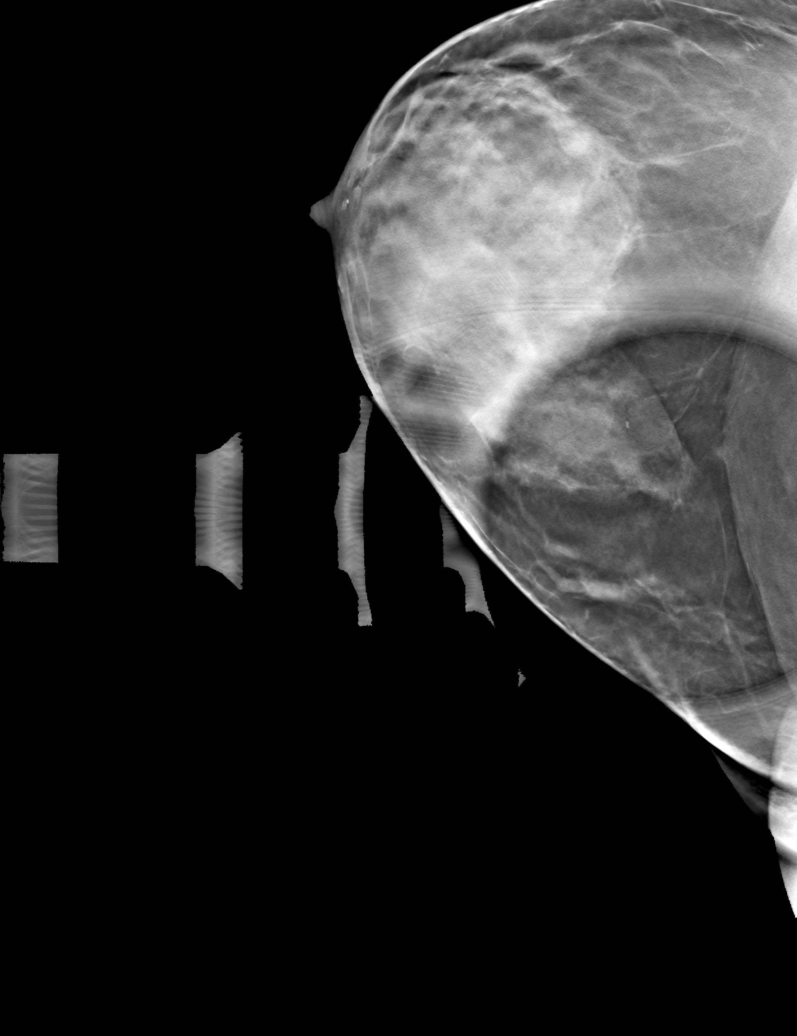

[6 of 30 positions shown; findings below may reference images not displayed]

ACR Breast Density Category c: The breast tissue is heterogeneously
dense, which may obscure small masses.
FINDINGS: The previously described finding seen best on MLO view does not
persist with additional views, consistent with superimposed
fibroglandular tissue. Fibroglandular tissue assumes a configuration
stable in comparison to remote prior mammograms. A residual 7 mm
oval circumscribed mass is noted in the LEFT upper outer breast at
posterior depth. An oval 3 mm circumscribed mass is noted in the
LEFT lower outer breast at posterior depth. No suspicious mass,
microcalcification, or other finding is identified.

On physical exam, no suspicious mass is appreciated.

Targeted ultrasound was performed of the LEFT outer breast. At 2
o'clock 7 cm from the nipple, there is an oval circumscribed
anechoic mass with posterior acoustic enhancement. It measures 7 x 3
x 6 mm and is consistent with a benign simple cyst. This corresponds
to the 7 mm oval mass noted mammographically.

Targeted ultrasound was performed of the LEFT lower outer breast. At
5 o'clock 5 cm from the nipple, there is an oval circumscribed
anechoic mass with posterior acoustic enhancement. It measures 3 x 3
x 3 mm and is consistent with a benign simple cyst. This corresponds
to the 3 mm oval circumscribed mass noted mammographically.
IMPRESSION: No mammographic or sonographic evidence of malignancy at the sites
of screening mammographic concern. There are scattered benign cysts
noted.

RECOMMENDATION:
Screening mammogram in one year.(Code:[44])

I have discussed the findings and recommendations with the patient.
If applicable, a reminder letter will be sent to the patient
regarding the next appointment.

BI-RADS CATEGORY  2: Benign.

## 2020-11-05 DIAGNOSIS — M5481 Occipital neuralgia: Secondary | ICD-10-CM | POA: Diagnosis not present

## 2020-11-09 ENCOUNTER — Other Ambulatory Visit: Payer: 59

## 2020-11-09 ENCOUNTER — Ambulatory Visit: Payer: 59

## 2020-11-09 DIAGNOSIS — M5481 Occipital neuralgia: Secondary | ICD-10-CM | POA: Diagnosis not present

## 2020-11-09 DIAGNOSIS — G43909 Migraine, unspecified, not intractable, without status migrainosus: Secondary | ICD-10-CM | POA: Diagnosis not present

## 2020-11-09 DIAGNOSIS — K2100 Gastro-esophageal reflux disease with esophagitis, without bleeding: Secondary | ICD-10-CM | POA: Diagnosis not present

## 2020-11-11 ENCOUNTER — Other Ambulatory Visit: Payer: 59

## 2020-11-11 ENCOUNTER — Ambulatory Visit: Payer: 59

## 2020-11-16 DIAGNOSIS — K2100 Gastro-esophageal reflux disease with esophagitis, without bleeding: Secondary | ICD-10-CM | POA: Diagnosis not present

## 2020-12-07 DIAGNOSIS — M5481 Occipital neuralgia: Secondary | ICD-10-CM | POA: Diagnosis not present

## 2020-12-14 DIAGNOSIS — K219 Gastro-esophageal reflux disease without esophagitis: Secondary | ICD-10-CM | POA: Diagnosis not present

## 2020-12-17 ENCOUNTER — Other Ambulatory Visit (HOSPITAL_COMMUNITY): Payer: Self-pay

## 2020-12-17 MED ORDER — PANTOPRAZOLE SODIUM 40 MG PO TBEC
DELAYED_RELEASE_TABLET | ORAL | 10 refills | Status: AC
  Filled 2020-12-17: qty 90, 90d supply, fill #0

## 2020-12-19 ENCOUNTER — Other Ambulatory Visit (HOSPITAL_COMMUNITY): Payer: Self-pay

## 2020-12-21 DIAGNOSIS — M542 Cervicalgia: Secondary | ICD-10-CM | POA: Diagnosis not present

## 2020-12-21 DIAGNOSIS — M5481 Occipital neuralgia: Secondary | ICD-10-CM | POA: Diagnosis not present

## 2021-01-10 ENCOUNTER — Ambulatory Visit
Admission: EM | Admit: 2021-01-10 | Discharge: 2021-01-10 | Disposition: A | Discharge status: Home / Self Care | Payer: 59

## 2021-01-10 DIAGNOSIS — R21 Rash and other nonspecific skin eruption: Secondary | ICD-10-CM

## 2021-01-10 DIAGNOSIS — M5481 Occipital neuralgia: Secondary | ICD-10-CM | POA: Diagnosis not present

## 2021-01-10 DIAGNOSIS — M542 Cervicalgia: Secondary | ICD-10-CM | POA: Diagnosis not present

## 2021-01-10 MED ORDER — METHYLPREDNISOLONE 4 MG PO TBPK: ORAL_TABLET | ORAL | 0 refills | Status: AC

## 2021-01-10 MED ORDER — VALACYCLOVIR HCL 1 G PO TABS: 1000.0000 mg | ORAL_TABLET | Freq: Three times a day (TID) | ORAL | 0 refills | Status: AC

## 2021-01-10 NOTE — ED Triage Notes (Signed)
Pt noticed a rash along her right forearm yesterday. Pt states that she did have shingles in may of 2022 along her forehead. Pt states that the sensation is similar to her shingles outbreak. Pt states that it is not spreading and has not gotten any larger. Pt states that it is not tender to touch, but is "tingly and itchy".  Pt denies any bug bites or any other patches of rash along her body.

## 2021-01-10 NOTE — ED Provider Notes (Signed)
[facility]    CSN: [id number] Arrival date & time: 01/10/21  0903      History   Chief Complaint Chief Complaint  Patient presents with   Rash    HPI Rachel Burch is a 46 y.o. female who presents with onset of rash on her R volar forearm. It has a tingling sensation and like it wants to itch, but she has not. Does not have a rash anywhere else. Reminds her as the same sensation when she had shingles on her forehead. Admits she does not sleep well, but denies increased stress. Is on Gabapentin for Occipital neuralgia. Denies injury of this area. Denies use of anything new on her arm.     History reviewed. No pertinent past medical history.  There are no problems to display for this patient.   Past Surgical History:  Procedure Laterality Date   APPENDECTOMY  09/09/2015   HIP SURGERY Left 04/17/2020    OB History   No obstetric history on file.      Home Medications    Prior to Admission medications   Medication Sig Start Date End Date Taking? Authorizing Provider  levonorgestrel (MIRENA) 20 MCG/24HR IUD by Intrauterine route.   Yes [provider]  methylPREDNISolone (MEDROL DOSEPAK) 4 MG TBPK tablet Take as directed 01/10/21  Yes Rodriguez-Southworth, Sunday Spillers, PA-C  pantoprazole (PROTONIX) 40 MG tablet Take 1 tablet by mouth once daily 11/09/20  Yes Tereasa Coop, PA-C  SUMAtriptan (IMITREX) 25 MG tablet  12/10/18  Yes [provider]  valACYclovir (VALTREX) 1000 MG tablet Take 1 tablet (1,000 mg total) by mouth 3 (three) times daily for 7 days. 01/10/21 01/17/21 Yes Rodriguez-Southworth, Sunday Spillers, PA-C  gabapentin (NEURONTIN) 100 MG capsule gabapentin 100 mg capsule    [provider]    Family History Family History  Problem Relation Age of Onset   Breast cancer Neg Hx     Social History Social History   Tobacco Use   Smoking status: Never   Smokeless tobacco: Never  Vaping Use   Vaping Use: Never used  Substance Use  Topics   Alcohol use: Not Currently   Drug use: Never     Allergies   Iodinated contrast media   Review of Systems Review of Systems  Skin:  Positive for color change and rash. Negative for pallor and wound.  Neurological:  Negative for numbness.       + tingling    Physical Exam Triage Vital Signs ED Triage Vitals  Enc Vitals Group     BP 01/10/21 0959 110/83     Pulse Rate 01/10/21 0959 90     Resp 01/10/21 0959 18     Temp 01/10/21 0959 98.5 F (36.9 C)     Temp Source 01/10/21 0959 Oral     SpO2 01/10/21 0959 100 %     Weight 01/10/21 0953 135 lb (61.2 kg)     Height 01/10/21 0953 5\' 2"  (1.575 m)     Head Circumference --      Peak Flow --      Pain Score 01/10/21 0951 0     Pain Loc --      Pain Edu? --      Excl. in [place]? --    No data found.  Updated Vital Signs BP 110/83 (BP Location: Right Leg)    Pulse 90    Temp 98.5 F (36.9 C) (Oral)    Resp 18    Ht 5\' 2"  (  1.575 m)    Wt 135 lb (61.2 kg)    SpO2 100%    BMI 24.69 kg/m   Visual Acuity Right Eye Distance:   Left Eye Distance:   Bilateral Distance:    Right Eye Near:   Left Eye Near:    Bilateral Near:     Physical Exam Vitals and nursing note reviewed.  Constitutional:      General: She is not in acute distress.    Appearance: She is normal weight. She is not toxic-appearing.  HENT:     Right Ear: External ear normal.     Left Ear: External ear normal.  Eyes:     General: No scleral icterus.    Conjunctiva/sclera: Conjunctivae normal.  Pulmonary:     Effort: Pulmonary effort is normal.  Musculoskeletal:     Cervical back: Neck supple.  Skin:    General: Skin is warm and dry.     Findings: Rash present.     Comments: R FOREARM- with slight erythematous 2 semicircle flat rash which is not hot or tender.    Neurological:     Mental Status: She is alert and oriented to person, place, and time.     Gait: Gait normal.  Psychiatric:        Mood and Affect: Mood normal.        Behavior:  Behavior normal.        Thought Content: Thought content normal.        Judgment: Judgment normal.     UC Treatments / Results  Labs (all labs ordered are listed, but only abnormal results are displayed) Labs Reviewed - No data to display  EKG   Radiology No results found.  Procedures Procedures (including critical care time)  Medications Ordered in UC Medications - No data to display  Initial Impression / Assessment and Plan / UC Course  I have reviewed the triage vital signs and the nursing notes. Atypical rash with neuritis type sensation. It could be early shingles therefore I went ahead and placed her on Medrol dose pack and Valtrex. She will FU if no improvement.      Final Clinical Impressions(s) / UC Diagnoses   Final diagnoses:  Rash   Discharge Instructions   None    ED Prescriptions     Medication Sig Dispense Auth. Provider   valACYclovir (VALTREX) 1000 MG tablet Take 1 tablet (1,000 mg total) by mouth 3 (three) times daily for 7 days. 21 tablet Rodriguez-Southworth, Jeramey Lanuza, PA-C   methylPREDNISolone (MEDROL DOSEPAK) 4 MG TBPK tablet Take as directed 21 tablet Rodriguez-Southworth, Sunday Spillers, PA-C      PDMP not reviewed this encounter.   Shelby Mattocks, PA-C 01/10/21 1029

## 2021-01-11 DIAGNOSIS — Z01419 Encounter for gynecological examination (general) (routine) without abnormal findings: Secondary | ICD-10-CM | POA: Diagnosis not present

## 2021-01-11 DIAGNOSIS — Z975 Presence of (intrauterine) contraceptive device: Secondary | ICD-10-CM | POA: Diagnosis not present

## 2021-01-11 DIAGNOSIS — Z1231 Encounter for screening mammogram for malignant neoplasm of breast: Secondary | ICD-10-CM | POA: Diagnosis not present

## 2021-01-17 DIAGNOSIS — M542 Cervicalgia: Secondary | ICD-10-CM | POA: Diagnosis not present

## 2021-01-17 DIAGNOSIS — M5481 Occipital neuralgia: Secondary | ICD-10-CM | POA: Diagnosis not present

## 2021-01-25 DIAGNOSIS — M5481 Occipital neuralgia: Secondary | ICD-10-CM | POA: Diagnosis not present

## 2021-01-25 DIAGNOSIS — M542 Cervicalgia: Secondary | ICD-10-CM | POA: Diagnosis not present

## 2021-02-01 DIAGNOSIS — G43909 Migraine, unspecified, not intractable, without status migrainosus: Secondary | ICD-10-CM | POA: Diagnosis not present

## 2021-02-01 DIAGNOSIS — M542 Cervicalgia: Secondary | ICD-10-CM | POA: Diagnosis not present

## 2021-02-01 DIAGNOSIS — M5481 Occipital neuralgia: Secondary | ICD-10-CM | POA: Diagnosis not present

## 2021-02-08 DIAGNOSIS — M542 Cervicalgia: Secondary | ICD-10-CM | POA: Diagnosis not present

## 2021-02-08 DIAGNOSIS — M5481 Occipital neuralgia: Secondary | ICD-10-CM | POA: Diagnosis not present

## 2021-02-15 DIAGNOSIS — D225 Melanocytic nevi of trunk: Secondary | ICD-10-CM | POA: Diagnosis not present

## 2021-02-15 DIAGNOSIS — D2262 Melanocytic nevi of left upper limb, including shoulder: Secondary | ICD-10-CM | POA: Diagnosis not present

## 2021-02-15 DIAGNOSIS — G4700 Insomnia, unspecified: Secondary | ICD-10-CM | POA: Diagnosis not present

## 2021-02-15 DIAGNOSIS — L821 Other seborrheic keratosis: Secondary | ICD-10-CM | POA: Diagnosis not present

## 2021-02-15 DIAGNOSIS — D2261 Melanocytic nevi of right upper limb, including shoulder: Secondary | ICD-10-CM | POA: Diagnosis not present

## 2021-02-15 DIAGNOSIS — D2272 Melanocytic nevi of left lower limb, including hip: Secondary | ICD-10-CM | POA: Diagnosis not present

## 2021-02-15 DIAGNOSIS — R0789 Other chest pain: Secondary | ICD-10-CM | POA: Diagnosis not present

## 2021-02-15 DIAGNOSIS — D2271 Melanocytic nevi of right lower limb, including hip: Secondary | ICD-10-CM | POA: Diagnosis not present

## 2021-02-15 DIAGNOSIS — L814 Other melanin hyperpigmentation: Secondary | ICD-10-CM | POA: Diagnosis not present

## 2021-02-15 DIAGNOSIS — L853 Xerosis cutis: Secondary | ICD-10-CM | POA: Diagnosis not present

## 2021-02-15 DIAGNOSIS — D224 Melanocytic nevi of scalp and neck: Secondary | ICD-10-CM | POA: Diagnosis not present

## 2021-03-04 DIAGNOSIS — H5213 Myopia, bilateral: Secondary | ICD-10-CM | POA: Diagnosis not present

## 2021-03-08 DIAGNOSIS — M5481 Occipital neuralgia: Secondary | ICD-10-CM | POA: Diagnosis not present

## 2021-03-08 DIAGNOSIS — M542 Cervicalgia: Secondary | ICD-10-CM | POA: Diagnosis not present

## 2021-03-15 DIAGNOSIS — R0789 Other chest pain: Secondary | ICD-10-CM | POA: Diagnosis not present

## 2021-03-22 DIAGNOSIS — R0789 Other chest pain: Secondary | ICD-10-CM | POA: Diagnosis not present

## 2021-03-29 DIAGNOSIS — R1013 Epigastric pain: Secondary | ICD-10-CM | POA: Diagnosis not present

## 2021-03-29 DIAGNOSIS — Z1330 Encounter for screening examination for mental health and behavioral disorders, unspecified: Secondary | ICD-10-CM | POA: Diagnosis not present

## 2021-03-29 DIAGNOSIS — Z1331 Encounter for screening for depression: Secondary | ICD-10-CM | POA: Diagnosis not present

## 2021-04-06 DIAGNOSIS — R0789 Other chest pain: Secondary | ICD-10-CM | POA: Diagnosis not present

## 2021-04-12 DIAGNOSIS — K219 Gastro-esophageal reflux disease without esophagitis: Secondary | ICD-10-CM | POA: Diagnosis not present

## 2021-04-12 DIAGNOSIS — Z823 Family history of stroke: Secondary | ICD-10-CM | POA: Diagnosis not present

## 2021-04-12 DIAGNOSIS — G8929 Other chronic pain: Secondary | ICD-10-CM | POA: Diagnosis not present

## 2021-04-12 DIAGNOSIS — Z8249 Family history of ischemic heart disease and other diseases of the circulatory system: Secondary | ICD-10-CM | POA: Diagnosis not present

## 2021-04-12 DIAGNOSIS — Z8371 Family history of colonic polyps: Secondary | ICD-10-CM | POA: Diagnosis not present

## 2021-04-12 DIAGNOSIS — Z8262 Family history of osteoporosis: Secondary | ICD-10-CM | POA: Diagnosis not present

## 2021-04-12 DIAGNOSIS — R1013 Epigastric pain: Secondary | ICD-10-CM | POA: Diagnosis not present

## 2021-04-12 DIAGNOSIS — Z8342 Family history of familial hypercholesterolemia: Secondary | ICD-10-CM | POA: Diagnosis not present

## 2021-04-12 DIAGNOSIS — J4520 Mild intermittent asthma, uncomplicated: Secondary | ICD-10-CM | POA: Diagnosis not present

## 2021-05-03 DIAGNOSIS — Z1211 Encounter for screening for malignant neoplasm of colon: Secondary | ICD-10-CM | POA: Diagnosis not present

## 2021-05-03 DIAGNOSIS — R1013 Epigastric pain: Secondary | ICD-10-CM | POA: Diagnosis not present

## 2021-05-03 DIAGNOSIS — Z0000 Encounter for general adult medical examination without abnormal findings: Secondary | ICD-10-CM | POA: Diagnosis not present

## 2021-05-03 DIAGNOSIS — N393 Stress incontinence (female) (male): Secondary | ICD-10-CM | POA: Diagnosis not present

## 2021-05-03 DIAGNOSIS — Z1331 Encounter for screening for depression: Secondary | ICD-10-CM | POA: Diagnosis not present

## 2021-05-03 DIAGNOSIS — Z1330 Encounter for screening examination for mental health and behavioral disorders, unspecified: Secondary | ICD-10-CM | POA: Diagnosis not present

## 2021-05-04 ENCOUNTER — Other Ambulatory Visit (HOSPITAL_COMMUNITY): Payer: Self-pay

## 2021-05-04 MED ORDER — MELOXICAM 15 MG PO TABS
15.0000 mg | ORAL_TABLET | Freq: Every day | ORAL | 2 refills | Status: AC
  Filled 2021-05-04: qty 30, 30d supply, fill #0

## 2021-05-05 ENCOUNTER — Other Ambulatory Visit (HOSPITAL_COMMUNITY): Payer: Self-pay

## 2021-05-08 ENCOUNTER — Other Ambulatory Visit: Payer: Self-pay | Admitting: Orthopedic Surgery

## 2021-05-09 ENCOUNTER — Other Ambulatory Visit: Payer: Self-pay | Admitting: Orthopedic Surgery

## 2021-05-09 DIAGNOSIS — M25552 Pain in left hip: Secondary | ICD-10-CM

## 2021-05-31 ENCOUNTER — Other Ambulatory Visit: Payer: Self-pay | Admitting: Orthopedic Surgery

## 2021-05-31 ENCOUNTER — Inpatient Hospital Stay: Admission: RE | Admit: 2021-05-31 | Payer: 59 | Source: Ambulatory Visit

## 2021-05-31 ENCOUNTER — Ambulatory Visit
Admission: RE | Admit: 2021-05-31 | Discharge: 2021-05-31 | Disposition: A | Discharge status: Home / Self Care | Payer: 59 | Source: Ambulatory Visit | Attending: Orthopedic Surgery | Admitting: Orthopedic Surgery

## 2021-05-31 DIAGNOSIS — Q6589 Other specified congenital deformities of hip: Secondary | ICD-10-CM | POA: Diagnosis not present

## 2021-05-31 DIAGNOSIS — M25552 Pain in left hip: Secondary | ICD-10-CM | POA: Insufficient documentation

## 2021-05-31 DIAGNOSIS — M25452 Effusion, left hip: Secondary | ICD-10-CM | POA: Diagnosis not present

## 2021-05-31 IMAGING — MR MR HIP*L* W/CM
4 of 6 series · 22 of 40 positions shown · IV contrast (agent unspecified)
Comparison: [DATE]

CLINICAL DATA: Persistent left hip pain. Prior left labral surgery
[DATE].

EXAM:
MRI OF THE LEFT HIP WITH CONTRAST (MR Arthrogram)
TECHNIQUE: Multiplanar, multisequence MR imaging of the hip was performed
immediately following contrast injection into the hip joint under
fluoroscopic guidance. No intravenous contrast was administered.

[Series 8: T1 · coronal · left · 4.0mm · 0.59mm/px · 6 of 25 slices shown]
[im 1/25]
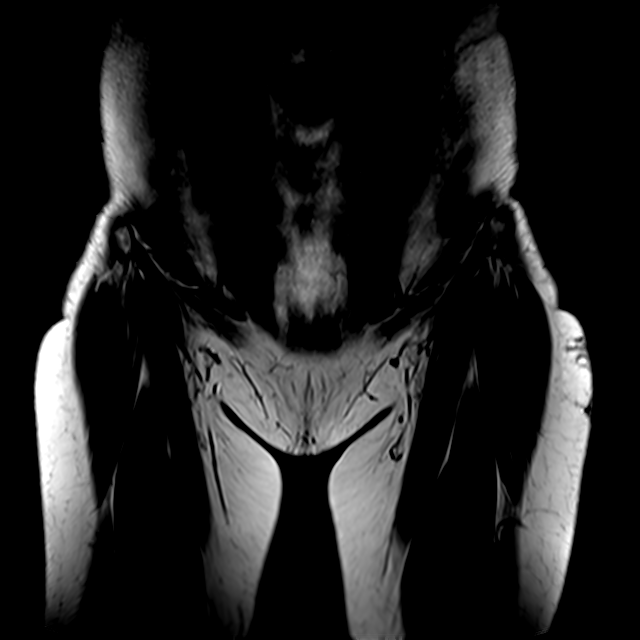
[im 5/25]
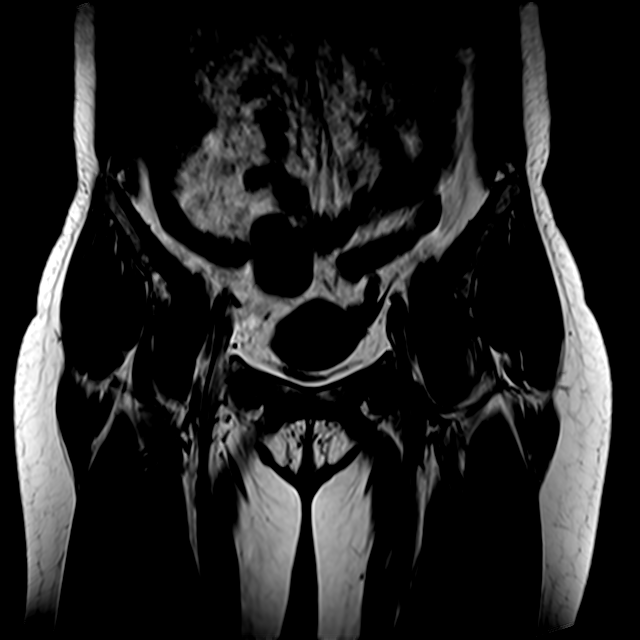
[im 10/25]
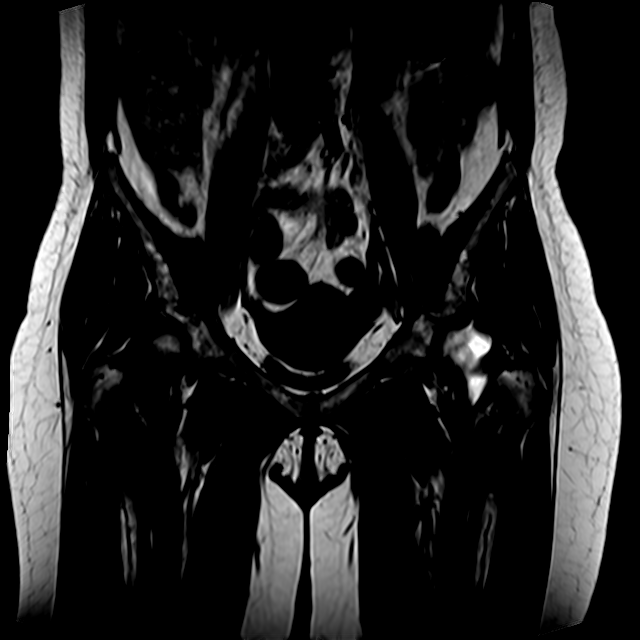
[im 15/25]
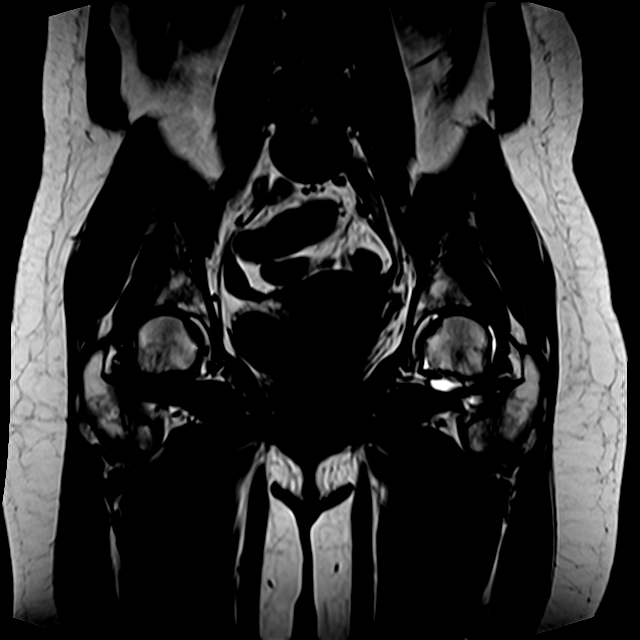
[im 20/25]
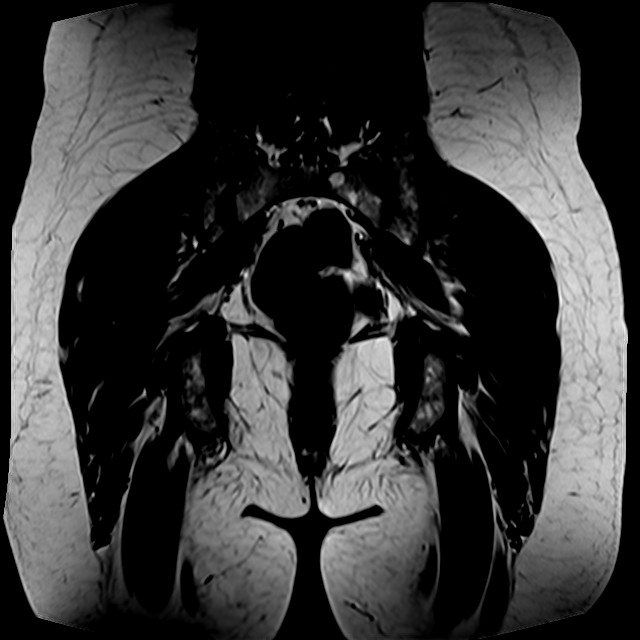
[im 25/25]
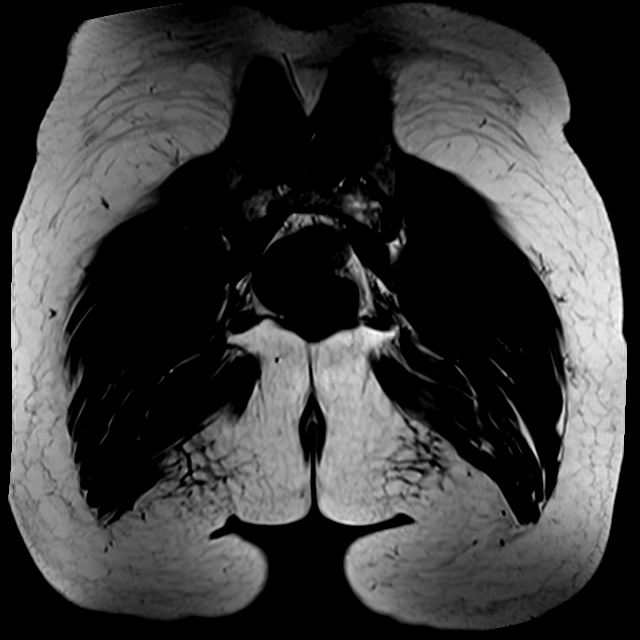

[Series 10: T2 fat-sat · axial · left · 4.0mm · 0.70mm/px · z∈[-49,+96]mm · 8 of 30 slices shown]
[im 1/30]
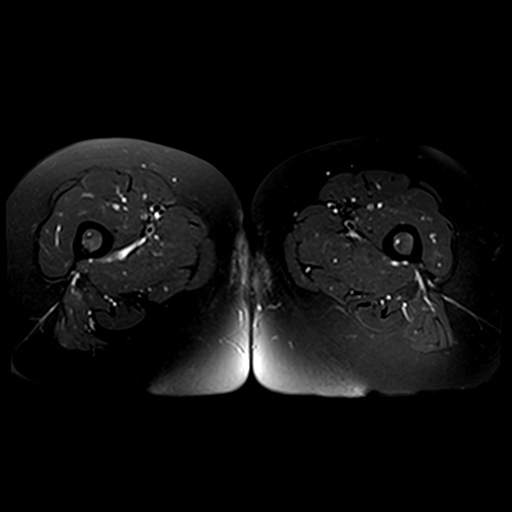
[im 5/30]
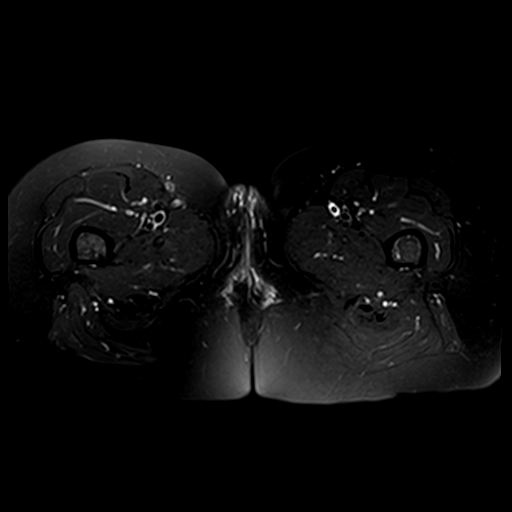
[im 9/30]
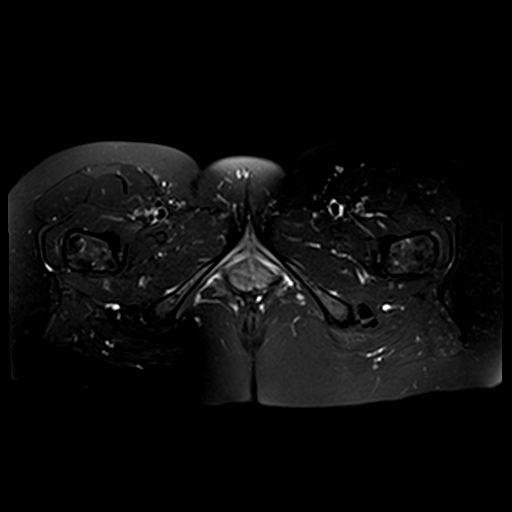
[im 13/30]
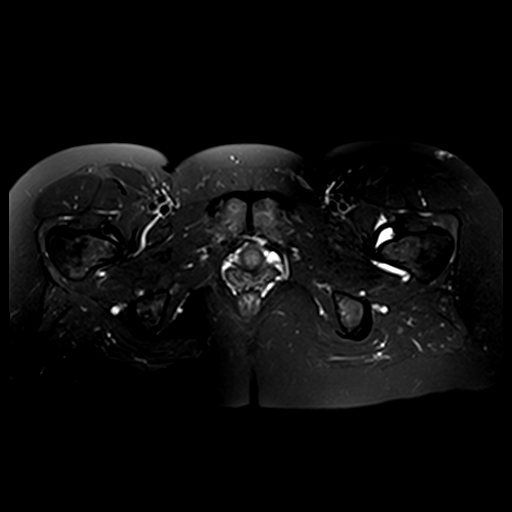
[im 17/30]
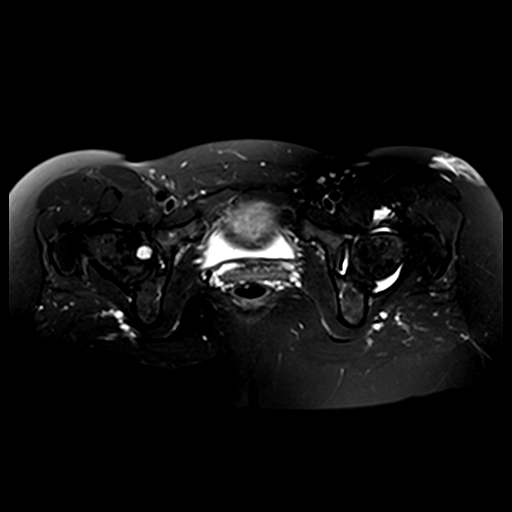
[im 21/30]
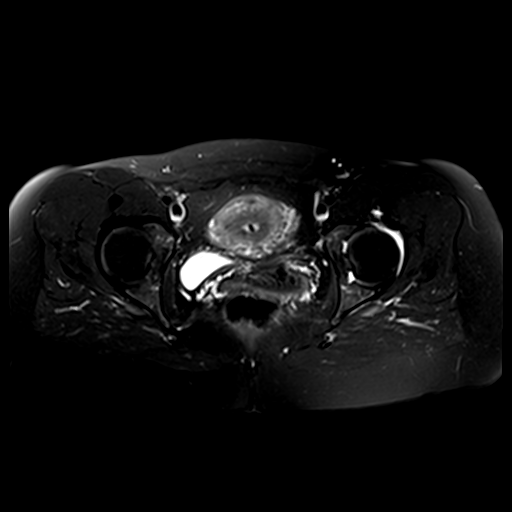
[im 25/30]
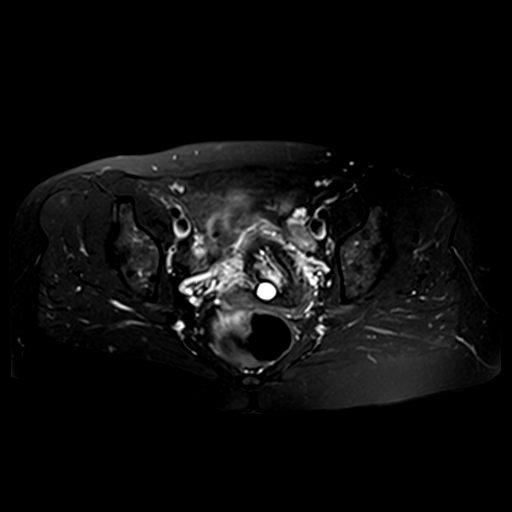
[im 30/30]
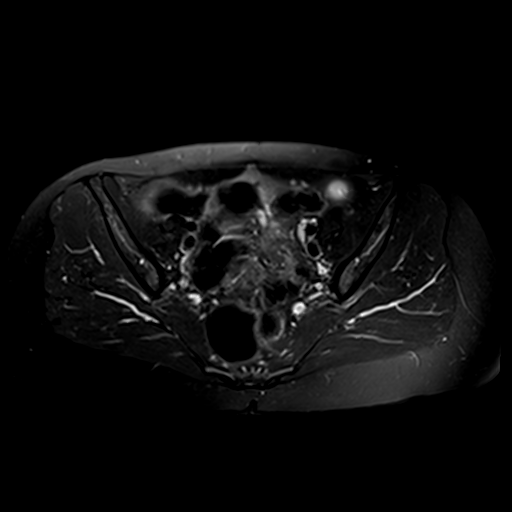

[Series 11: T1 fat-sat · axial · left · 4.0mm · 0.70mm/px · z∈[-41,+67]mm · 5 of 30 slices shown (1 of 2)]
[im 1/30]
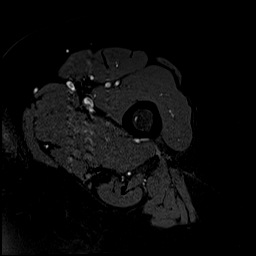
[im 5/30]
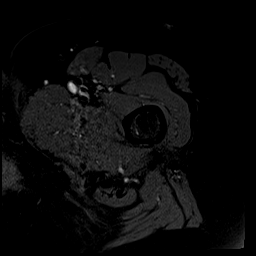
[im 9/30]
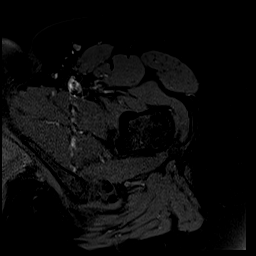
[im 17/30]
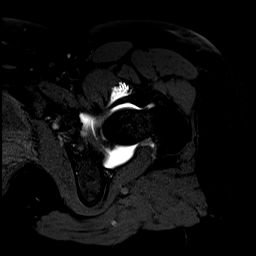
[im 25/30]
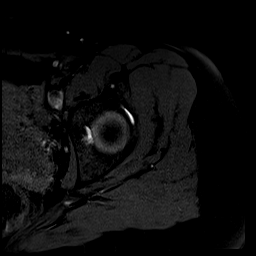

[Series 12: T1 fat-sat · sagittal · left · 4.0mm · 0.62mm/px · 3 of 25 slices shown (2 of 2)]
[im 5/25]
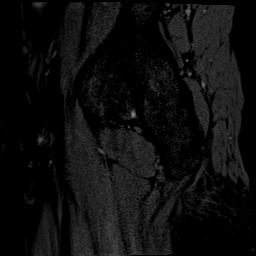
[im 15/25]
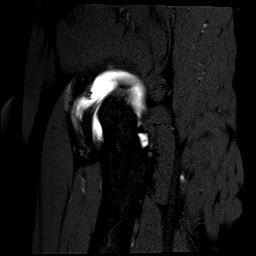
[im 25/25]
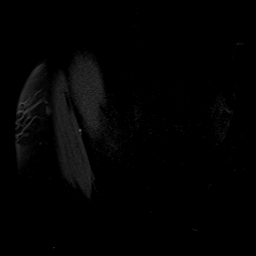

[22 of 40 positions shown; findings below may reference images not displayed]

FINDINGS: Bone

No hip fracture, dislocation or avascular necrosis. No aggressive
osseous lesion. Benign cystic area in the inferior aspect of the
right femoral head likely reflecting a benign chondroid lesion.

SI joints are normal. No SI joint widening or erosive changes.

Lower lumbar spine demonstrates no focal abnormality.

Alignment

Normal. No subluxation.

Dysplasia

Interval partial osteotomy of the superior anterior left femoral
head-neck junction.

Joint effusion

Intraarticular contrast distends the left hip joint capsule.
Otherwise, no joint effusions.

Labrum

Increased signal and mild blunting of the left anterior labrum
likely postsurgical. No discrete recurrent left labral tear.

Cartilage

No chondral defect.

Capsule and ligaments

Defect in the left anterior joint capsule which may reflect a tear
versus postsurgical changes.

Muscles and Tendons

Flexors: Normal.

Extensors: Normal.

Abductors: Normal.

Adductors: Normal.

Rotators: Normal.

Hamstrings: Normal.

Other Findings

No bursal fluid.

Viscera

No abnormality seen in pelvis. No lymphadenopathy. No free fluid in
the pelvis.
IMPRESSION: 1. Increased signal and mild blunting of the left anterior labrum
likely postsurgical. No discrete recurrent left labral tear. Defect
in the left anterior joint capsule which may reflect a tear versus
postsurgical changes.

## 2021-05-31 IMAGING — RF DG FLUORO GUIDE NDL PLC/BX
1 series · 1 of 1 positions shown · non-contrast
Comparison: None Available.

CLINICAL DATA: Persistent left hip pain.

EXAM:
LEFT HIP ARTHROGRAM UNDER FLUOROSCOPY

[Series 1: cp_standard · 0.18mm/px · 1 of 1 slices shown]
[im 1/1]
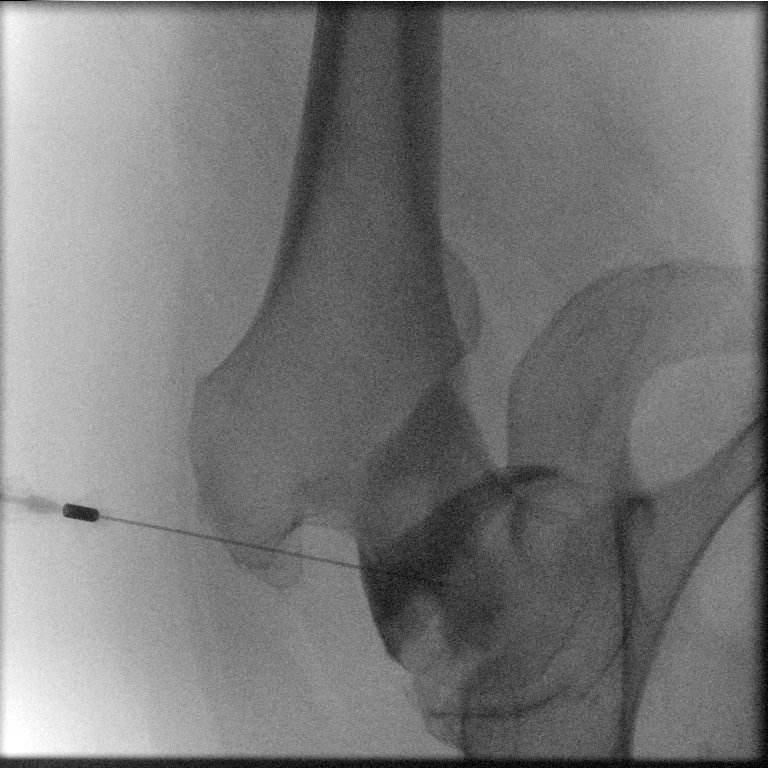

[1 of 1 positions shown; findings below may reference images not displayed]

FLUOROSCOPY:
Radiation Exposure Index (as provided by the fluoroscopic device):
1.2 mGy

PROCEDURE:
The risks and benefits of the procedure were discussed with the
patient, and written informed consent was obtained. The patient
stated no history of allergy to contrast media. A formal timeout
procedure was performed with the patient according to departmental
protocol.

The patient was placed supine on the fluoroscopy table and the left
hip joint was identified under fluoroscopy. The skin overlying the
joint was subsequently cleaned with Chloraprep and a sterile drape
was placed over the area of interest. 5 ml 1% Lidocaine was used to
anesthetize the skin around the needle insertion site.

A 22 gauge spinal needle was inserted into the joint under
fluoroscopy. Position was confirmed with injection of less than 1ml
of Omnipaque 180 under fluoroscopy.

12 ml of gadolinium mixture (0.05 mL of Gadavist mixed with 10 mL
sterile saline and 10 mL Omnipaque 180) was injected into the joint.

The needle was removed and hemostasis was achieved. The patient was
subsequently transferred to MRI for imaging.
IMPRESSION: Technically successful left hip arthrogram under fluoroscopy.

## 2021-05-31 MED ORDER — LIDOCAINE HCL (PF) 1 % IJ SOLN
7.0000 mL | Freq: Once | INTRAMUSCULAR | Status: DC
  Filled 2021-05-31: qty 8

## 2021-05-31 MED ORDER — GADOBUTROL 1 MMOL/ML IV SOLN
0.1000 mL | Freq: Once | INTRAVENOUS | Status: AC | PRN
Start: 2021-05-31 — End: 2021-05-31
  Administered 2021-05-31: 0.1 mL

## 2021-05-31 MED ORDER — IOHEXOL 180 MG/ML  SOLN
13.0000 mL | Freq: Once | INTRAMUSCULAR | Status: AC | PRN
  Administered 2021-05-31: 13 mL via INTRA_ARTICULAR

## 2021-06-07 DIAGNOSIS — M25552 Pain in left hip: Secondary | ICD-10-CM | POA: Diagnosis not present

## 2021-06-30 DIAGNOSIS — M25559 Pain in unspecified hip: Secondary | ICD-10-CM | POA: Diagnosis not present

## 2021-06-30 DIAGNOSIS — M25552 Pain in left hip: Secondary | ICD-10-CM | POA: Diagnosis not present

## 2021-07-26 DIAGNOSIS — R1013 Epigastric pain: Secondary | ICD-10-CM | POA: Diagnosis not present

## 2021-07-27 ENCOUNTER — Other Ambulatory Visit: Payer: Self-pay | Admitting: Gastroenterology

## 2021-07-27 DIAGNOSIS — R1013 Epigastric pain: Secondary | ICD-10-CM

## 2021-08-02 ENCOUNTER — Ambulatory Visit: Payer: 59

## 2021-08-08 ENCOUNTER — Ambulatory Visit
Admission: RE | Admit: 2021-08-08 | Discharge: 2021-08-08 | Disposition: A | Discharge status: Home / Self Care | Payer: 59 | Source: Ambulatory Visit | Attending: Gastroenterology | Admitting: Gastroenterology

## 2021-08-08 DIAGNOSIS — R1013 Epigastric pain: Secondary | ICD-10-CM | POA: Insufficient documentation

## 2021-08-08 DIAGNOSIS — R109 Unspecified abdominal pain: Secondary | ICD-10-CM | POA: Diagnosis not present

## 2021-08-08 MED ORDER — IOHEXOL 300 MG/ML  SOLN
100.0000 mL | Freq: Once | INTRAMUSCULAR | Status: AC | PRN
  Administered 2021-08-08: 100 mL via INTRAVENOUS

## 2021-08-22 ENCOUNTER — Other Ambulatory Visit: Payer: Self-pay | Admitting: Nurse Practitioner

## 2021-08-22 DIAGNOSIS — Z1231 Encounter for screening mammogram for malignant neoplasm of breast: Secondary | ICD-10-CM

## 2021-10-19 ENCOUNTER — Other Ambulatory Visit (HOSPITAL_COMMUNITY): Payer: Self-pay

## 2021-10-19 DIAGNOSIS — Z23 Encounter for immunization: Secondary | ICD-10-CM | POA: Diagnosis not present

## 2021-10-19 DIAGNOSIS — J4520 Mild intermittent asthma, uncomplicated: Secondary | ICD-10-CM | POA: Diagnosis not present

## 2021-10-19 MED ORDER — ALBUTEROL SULFATE HFA 108 (90 BASE) MCG/ACT IN AERS
INHALATION_SPRAY | RESPIRATORY_TRACT | 11 refills | Status: AC
  Filled 2021-10-19: qty 20.1, 90d supply, fill #0

## 2021-10-19 MED ORDER — FLUTICASONE-SALMETEROL 45-21 MCG/ACT IN AERO
INHALATION_SPRAY | RESPIRATORY_TRACT | 11 refills | Status: DC
  Filled 2021-10-19: qty 12, 30d supply, fill #0
  Filled 2022-05-03: qty 12, 30d supply, fill #1

## 2021-10-23 ENCOUNTER — Other Ambulatory Visit (HOSPITAL_COMMUNITY): Payer: Self-pay

## 2021-10-24 ENCOUNTER — Other Ambulatory Visit (HOSPITAL_COMMUNITY): Payer: Self-pay

## 2021-10-25 ENCOUNTER — Ambulatory Visit: Payer: 59

## 2021-10-26 ENCOUNTER — Ambulatory Visit
Admission: RE | Admit: 2021-10-26 | Discharge: 2021-10-26 | Disposition: A | Discharge status: Home / Self Care | Payer: 59 | Source: Ambulatory Visit | Attending: Nurse Practitioner | Admitting: Nurse Practitioner

## 2021-10-26 DIAGNOSIS — Z1231 Encounter for screening mammogram for malignant neoplasm of breast: Secondary | ICD-10-CM | POA: Insufficient documentation

## 2021-11-03 DIAGNOSIS — Z1211 Encounter for screening for malignant neoplasm of colon: Secondary | ICD-10-CM | POA: Diagnosis not present

## 2021-11-13 DIAGNOSIS — M25552 Pain in left hip: Secondary | ICD-10-CM | POA: Diagnosis not present

## 2021-11-13 DIAGNOSIS — M7062 Trochanteric bursitis, left hip: Secondary | ICD-10-CM | POA: Diagnosis not present

## 2021-11-13 DIAGNOSIS — M7061 Trochanteric bursitis, right hip: Secondary | ICD-10-CM | POA: Diagnosis not present

## 2021-12-18 DIAGNOSIS — M25552 Pain in left hip: Secondary | ICD-10-CM | POA: Diagnosis not present

## 2022-01-15 DIAGNOSIS — Z1231 Encounter for screening mammogram for malignant neoplasm of breast: Secondary | ICD-10-CM | POA: Diagnosis not present

## 2022-01-15 DIAGNOSIS — Z01419 Encounter for gynecological examination (general) (routine) without abnormal findings: Secondary | ICD-10-CM | POA: Diagnosis not present

## 2022-01-15 DIAGNOSIS — T8332XD Displacement of intrauterine contraceptive device, subsequent encounter: Secondary | ICD-10-CM | POA: Diagnosis not present

## 2022-01-15 DIAGNOSIS — Z975 Presence of (intrauterine) contraceptive device: Secondary | ICD-10-CM | POA: Diagnosis not present

## 2022-02-06 DIAGNOSIS — H5213 Myopia, bilateral: Secondary | ICD-10-CM | POA: Diagnosis not present

## 2022-02-21 DIAGNOSIS — D2261 Melanocytic nevi of right upper limb, including shoulder: Secondary | ICD-10-CM | POA: Diagnosis not present

## 2022-02-21 DIAGNOSIS — R208 Other disturbances of skin sensation: Secondary | ICD-10-CM | POA: Diagnosis not present

## 2022-02-21 DIAGNOSIS — D2271 Melanocytic nevi of right lower limb, including hip: Secondary | ICD-10-CM | POA: Diagnosis not present

## 2022-02-21 DIAGNOSIS — D2272 Melanocytic nevi of left lower limb, including hip: Secondary | ICD-10-CM | POA: Diagnosis not present

## 2022-02-21 DIAGNOSIS — D485 Neoplasm of uncertain behavior of skin: Secondary | ICD-10-CM | POA: Diagnosis not present

## 2022-02-21 DIAGNOSIS — D225 Melanocytic nevi of trunk: Secondary | ICD-10-CM | POA: Diagnosis not present

## 2022-02-21 DIAGNOSIS — L814 Other melanin hyperpigmentation: Secondary | ICD-10-CM | POA: Diagnosis not present

## 2022-02-21 DIAGNOSIS — L538 Other specified erythematous conditions: Secondary | ICD-10-CM | POA: Diagnosis not present

## 2022-02-21 DIAGNOSIS — D224 Melanocytic nevi of scalp and neck: Secondary | ICD-10-CM | POA: Diagnosis not present

## 2022-02-21 DIAGNOSIS — D2262 Melanocytic nevi of left upper limb, including shoulder: Secondary | ICD-10-CM | POA: Diagnosis not present

## 2022-02-28 DIAGNOSIS — R519 Headache, unspecified: Secondary | ICD-10-CM | POA: Diagnosis not present

## 2022-03-04 DIAGNOSIS — M545 Low back pain, unspecified: Secondary | ICD-10-CM | POA: Diagnosis not present

## 2022-04-04 DIAGNOSIS — R5383 Other fatigue: Secondary | ICD-10-CM | POA: Diagnosis not present

## 2022-04-09 DIAGNOSIS — S73191A Other sprain of right hip, initial encounter: Secondary | ICD-10-CM | POA: Diagnosis not present

## 2022-04-09 DIAGNOSIS — S73192A Other sprain of left hip, initial encounter: Secondary | ICD-10-CM | POA: Diagnosis not present

## 2022-04-10 DIAGNOSIS — R0683 Snoring: Secondary | ICD-10-CM | POA: Diagnosis not present

## 2022-04-10 DIAGNOSIS — G501 Atypical facial pain: Secondary | ICD-10-CM | POA: Diagnosis not present

## 2022-04-23 ENCOUNTER — Other Ambulatory Visit: Payer: Self-pay | Admitting: Family

## 2022-04-23 DIAGNOSIS — G501 Atypical facial pain: Secondary | ICD-10-CM

## 2022-04-24 DIAGNOSIS — M545 Low back pain, unspecified: Secondary | ICD-10-CM | POA: Diagnosis not present

## 2022-05-01 DIAGNOSIS — M545 Low back pain, unspecified: Secondary | ICD-10-CM | POA: Diagnosis not present

## 2022-05-03 ENCOUNTER — Other Ambulatory Visit: Payer: Self-pay

## 2022-05-03 ENCOUNTER — Other Ambulatory Visit (HOSPITAL_COMMUNITY): Payer: Self-pay

## 2022-05-13 ENCOUNTER — Ambulatory Visit
Admission: RE | Admit: 2022-05-13 | Discharge: 2022-05-13 | Disposition: A | Discharge status: Home / Self Care | Payer: 59 | Source: Ambulatory Visit | Attending: Family | Admitting: Family

## 2022-05-13 DIAGNOSIS — G501 Atypical facial pain: Secondary | ICD-10-CM | POA: Diagnosis not present

## 2022-05-13 DIAGNOSIS — R519 Headache, unspecified: Secondary | ICD-10-CM | POA: Diagnosis not present

## 2022-05-13 MED ORDER — GADOBUTROL 1 MMOL/ML IV SOLN
6.0000 mL | Freq: Once | INTRAVENOUS | Status: AC | PRN
  Administered 2022-05-13: 7.5 mL via INTRAVENOUS

## 2022-05-15 DIAGNOSIS — M5416 Radiculopathy, lumbar region: Secondary | ICD-10-CM | POA: Diagnosis not present

## 2022-05-22 DIAGNOSIS — M25852 Other specified joint disorders, left hip: Secondary | ICD-10-CM | POA: Diagnosis not present

## 2022-05-22 DIAGNOSIS — M5416 Radiculopathy, lumbar region: Secondary | ICD-10-CM | POA: Diagnosis not present

## 2022-05-22 DIAGNOSIS — M25552 Pain in left hip: Secondary | ICD-10-CM | POA: Diagnosis not present

## 2022-05-23 DIAGNOSIS — Z01818 Encounter for other preprocedural examination: Secondary | ICD-10-CM | POA: Diagnosis not present

## 2022-06-21 DIAGNOSIS — R140 Abdominal distension (gaseous): Secondary | ICD-10-CM | POA: Diagnosis not present

## 2022-06-21 DIAGNOSIS — R197 Diarrhea, unspecified: Secondary | ICD-10-CM | POA: Diagnosis not present

## 2022-06-21 DIAGNOSIS — K5900 Constipation, unspecified: Secondary | ICD-10-CM | POA: Diagnosis not present

## 2022-06-21 DIAGNOSIS — R143 Flatulence: Secondary | ICD-10-CM | POA: Diagnosis not present

## 2022-06-25 DIAGNOSIS — M24152 Other articular cartilage disorders, left hip: Secondary | ICD-10-CM | POA: Diagnosis not present

## 2022-06-25 DIAGNOSIS — S73192A Other sprain of left hip, initial encounter: Secondary | ICD-10-CM | POA: Diagnosis not present

## 2022-06-25 DIAGNOSIS — S73191A Other sprain of right hip, initial encounter: Secondary | ICD-10-CM | POA: Diagnosis not present

## 2022-06-25 DIAGNOSIS — G8918 Other acute postprocedural pain: Secondary | ICD-10-CM | POA: Diagnosis not present

## 2022-07-16 DIAGNOSIS — M25852 Other specified joint disorders, left hip: Secondary | ICD-10-CM | POA: Diagnosis not present

## 2022-07-16 DIAGNOSIS — M25552 Pain in left hip: Secondary | ICD-10-CM | POA: Diagnosis not present

## 2022-07-16 DIAGNOSIS — M5416 Radiculopathy, lumbar region: Secondary | ICD-10-CM | POA: Diagnosis not present

## 2022-07-19 DIAGNOSIS — M25852 Other specified joint disorders, left hip: Secondary | ICD-10-CM | POA: Diagnosis not present

## 2022-07-19 DIAGNOSIS — M5416 Radiculopathy, lumbar region: Secondary | ICD-10-CM | POA: Diagnosis not present

## 2022-07-19 DIAGNOSIS — M25552 Pain in left hip: Secondary | ICD-10-CM | POA: Diagnosis not present

## 2022-07-23 DIAGNOSIS — M5416 Radiculopathy, lumbar region: Secondary | ICD-10-CM | POA: Diagnosis not present

## 2022-07-23 DIAGNOSIS — M25852 Other specified joint disorders, left hip: Secondary | ICD-10-CM | POA: Diagnosis not present

## 2022-07-23 DIAGNOSIS — M25552 Pain in left hip: Secondary | ICD-10-CM | POA: Diagnosis not present

## 2022-07-26 DIAGNOSIS — M25852 Other specified joint disorders, left hip: Secondary | ICD-10-CM | POA: Diagnosis not present

## 2022-07-26 DIAGNOSIS — M5416 Radiculopathy, lumbar region: Secondary | ICD-10-CM | POA: Diagnosis not present

## 2022-07-26 DIAGNOSIS — M25552 Pain in left hip: Secondary | ICD-10-CM | POA: Diagnosis not present

## 2022-07-30 DIAGNOSIS — M25552 Pain in left hip: Secondary | ICD-10-CM | POA: Diagnosis not present

## 2022-07-30 DIAGNOSIS — M5416 Radiculopathy, lumbar region: Secondary | ICD-10-CM | POA: Diagnosis not present

## 2022-07-30 DIAGNOSIS — M25852 Other specified joint disorders, left hip: Secondary | ICD-10-CM | POA: Diagnosis not present

## 2022-07-31 ENCOUNTER — Other Ambulatory Visit: Payer: Self-pay | Admitting: Oncology

## 2022-07-31 DIAGNOSIS — Z006 Encounter for examination for normal comparison and control in clinical research program: Secondary | ICD-10-CM

## 2022-08-02 DIAGNOSIS — M5416 Radiculopathy, lumbar region: Secondary | ICD-10-CM | POA: Diagnosis not present

## 2022-08-02 DIAGNOSIS — M25852 Other specified joint disorders, left hip: Secondary | ICD-10-CM | POA: Diagnosis not present

## 2022-08-02 DIAGNOSIS — M25552 Pain in left hip: Secondary | ICD-10-CM | POA: Diagnosis not present

## 2022-08-11 DIAGNOSIS — J069 Acute upper respiratory infection, unspecified: Secondary | ICD-10-CM | POA: Diagnosis not present

## 2022-08-11 DIAGNOSIS — J4530 Mild persistent asthma, uncomplicated: Secondary | ICD-10-CM | POA: Diagnosis not present

## 2022-08-11 DIAGNOSIS — U071 COVID-19: Secondary | ICD-10-CM | POA: Diagnosis not present

## 2022-08-19 DIAGNOSIS — R000 Tachycardia, unspecified: Secondary | ICD-10-CM | POA: Diagnosis not present

## 2022-08-19 DIAGNOSIS — Z91041 Radiographic dye allergy status: Secondary | ICD-10-CM | POA: Diagnosis not present

## 2022-08-19 DIAGNOSIS — R0602 Shortness of breath: Secondary | ICD-10-CM | POA: Diagnosis not present

## 2022-08-19 DIAGNOSIS — K219 Gastro-esophageal reflux disease without esophagitis: Secondary | ICD-10-CM | POA: Diagnosis not present

## 2022-08-19 DIAGNOSIS — J45901 Unspecified asthma with (acute) exacerbation: Secondary | ICD-10-CM | POA: Diagnosis not present

## 2022-08-19 DIAGNOSIS — Z79899 Other long term (current) drug therapy: Secondary | ICD-10-CM | POA: Diagnosis not present

## 2022-08-20 DIAGNOSIS — M25852 Other specified joint disorders, left hip: Secondary | ICD-10-CM | POA: Diagnosis not present

## 2022-08-20 DIAGNOSIS — J45901 Unspecified asthma with (acute) exacerbation: Secondary | ICD-10-CM | POA: Diagnosis not present

## 2022-08-20 DIAGNOSIS — M5416 Radiculopathy, lumbar region: Secondary | ICD-10-CM | POA: Diagnosis not present

## 2022-08-20 DIAGNOSIS — M25552 Pain in left hip: Secondary | ICD-10-CM | POA: Diagnosis not present

## 2022-08-27 ENCOUNTER — Other Ambulatory Visit: Payer: Self-pay

## 2022-08-27 ENCOUNTER — Other Ambulatory Visit (HOSPITAL_COMMUNITY): Payer: Self-pay

## 2022-08-27 MED ORDER — MELOXICAM 15 MG PO TABS
15.0000 mg | ORAL_TABLET | Freq: Every day | ORAL | 0 refills | Status: AC
  Filled 2022-08-27: qty 42, 42d supply, fill #0

## 2022-08-29 DIAGNOSIS — R0602 Shortness of breath: Secondary | ICD-10-CM | POA: Diagnosis not present

## 2022-08-29 DIAGNOSIS — R0982 Postnasal drip: Secondary | ICD-10-CM | POA: Diagnosis not present

## 2022-08-29 DIAGNOSIS — J4520 Mild intermittent asthma, uncomplicated: Secondary | ICD-10-CM | POA: Diagnosis not present

## 2022-09-05 DIAGNOSIS — R0602 Shortness of breath: Secondary | ICD-10-CM | POA: Diagnosis not present

## 2022-09-05 DIAGNOSIS — J4520 Mild intermittent asthma, uncomplicated: Secondary | ICD-10-CM | POA: Diagnosis not present

## 2022-09-05 DIAGNOSIS — U099 Post covid-19 condition, unspecified: Secondary | ICD-10-CM | POA: Diagnosis not present

## 2022-09-05 DIAGNOSIS — R0789 Other chest pain: Secondary | ICD-10-CM | POA: Diagnosis not present

## 2022-09-18 ENCOUNTER — Other Ambulatory Visit: Payer: Self-pay | Admitting: Nurse Practitioner

## 2022-09-18 DIAGNOSIS — Z1231 Encounter for screening mammogram for malignant neoplasm of breast: Secondary | ICD-10-CM

## 2022-09-25 ENCOUNTER — Other Ambulatory Visit (HOSPITAL_COMMUNITY): Payer: Self-pay

## 2022-09-26 ENCOUNTER — Other Ambulatory Visit: Payer: Self-pay

## 2022-09-26 ENCOUNTER — Other Ambulatory Visit (HOSPITAL_COMMUNITY): Payer: Self-pay

## 2022-09-26 DIAGNOSIS — M25852 Other specified joint disorders, left hip: Secondary | ICD-10-CM | POA: Diagnosis not present

## 2022-09-26 DIAGNOSIS — M25552 Pain in left hip: Secondary | ICD-10-CM | POA: Diagnosis not present

## 2022-09-26 DIAGNOSIS — M5416 Radiculopathy, lumbar region: Secondary | ICD-10-CM | POA: Diagnosis not present

## 2022-09-26 MED ORDER — TRELEGY ELLIPTA 100-62.5-25 MCG/ACT IN AEPB
1.0000 | INHALATION_SPRAY | Freq: Every day | RESPIRATORY_TRACT | 3 refills | Status: DC
  Filled 2022-09-26: qty 180, 90d supply, fill #0

## 2022-09-30 ENCOUNTER — Ambulatory Visit
Admission: RE | Admit: 2022-09-30 | Discharge: 2022-09-30 | Disposition: A | Discharge status: Home / Self Care | Payer: 59 | Source: Ambulatory Visit | Attending: Gastroenterology | Admitting: Gastroenterology

## 2022-09-30 ENCOUNTER — Ambulatory Visit
Admission: RE | Admit: 2022-09-30 | Discharge: 2022-09-30 | Disposition: A | Discharge status: Home / Self Care | Payer: 59 | Attending: Gastroenterology | Admitting: Gastroenterology

## 2022-09-30 ENCOUNTER — Other Ambulatory Visit: Payer: Self-pay | Admitting: Gastroenterology

## 2022-09-30 DIAGNOSIS — K5909 Other constipation: Secondary | ICD-10-CM | POA: Insufficient documentation

## 2022-09-30 DIAGNOSIS — R143 Flatulence: Secondary | ICD-10-CM | POA: Insufficient documentation

## 2022-09-30 DIAGNOSIS — R197 Diarrhea, unspecified: Secondary | ICD-10-CM | POA: Diagnosis not present

## 2022-09-30 DIAGNOSIS — R140 Abdominal distension (gaseous): Secondary | ICD-10-CM | POA: Diagnosis not present

## 2022-10-05 DIAGNOSIS — M25552 Pain in left hip: Secondary | ICD-10-CM | POA: Diagnosis not present

## 2022-10-23 DIAGNOSIS — Z1331 Encounter for screening for depression: Secondary | ICD-10-CM | POA: Diagnosis not present

## 2022-10-23 DIAGNOSIS — J3489 Other specified disorders of nose and nasal sinuses: Secondary | ICD-10-CM | POA: Diagnosis not present

## 2022-10-23 DIAGNOSIS — Z23 Encounter for immunization: Secondary | ICD-10-CM | POA: Diagnosis not present

## 2022-10-23 DIAGNOSIS — Z0000 Encounter for general adult medical examination without abnormal findings: Secondary | ICD-10-CM | POA: Diagnosis not present

## 2022-10-23 DIAGNOSIS — Z13220 Encounter for screening for lipoid disorders: Secondary | ICD-10-CM | POA: Diagnosis not present

## 2022-10-23 DIAGNOSIS — J4520 Mild intermittent asthma, uncomplicated: Secondary | ICD-10-CM | POA: Diagnosis not present

## 2022-10-29 ENCOUNTER — Ambulatory Visit
Admission: RE | Admit: 2022-10-29 | Discharge: 2022-10-29 | Disposition: A | Discharge status: Home / Self Care | Payer: 59 | Source: Ambulatory Visit | Attending: Nurse Practitioner | Admitting: Nurse Practitioner

## 2022-10-29 DIAGNOSIS — Z1231 Encounter for screening mammogram for malignant neoplasm of breast: Secondary | ICD-10-CM | POA: Insufficient documentation

## 2022-11-12 DIAGNOSIS — R61 Generalized hyperhidrosis: Secondary | ICD-10-CM | POA: Diagnosis not present

## 2022-11-12 DIAGNOSIS — Z30432 Encounter for removal of intrauterine contraceptive device: Secondary | ICD-10-CM | POA: Diagnosis not present

## 2022-11-12 DIAGNOSIS — N393 Stress incontinence (female) (male): Secondary | ICD-10-CM | POA: Diagnosis not present

## 2022-11-12 DIAGNOSIS — R92343 Mammographic extreme density, bilateral breasts: Secondary | ICD-10-CM | POA: Diagnosis not present

## 2022-11-12 DIAGNOSIS — Z3009 Encounter for other general counseling and advice on contraception: Secondary | ICD-10-CM | POA: Diagnosis not present

## 2022-11-14 DIAGNOSIS — G43909 Migraine, unspecified, not intractable, without status migrainosus: Secondary | ICD-10-CM | POA: Diagnosis not present

## 2022-11-19 DIAGNOSIS — M5416 Radiculopathy, lumbar region: Secondary | ICD-10-CM | POA: Diagnosis not present

## 2022-11-19 DIAGNOSIS — M25551 Pain in right hip: Secondary | ICD-10-CM | POA: Diagnosis not present

## 2022-11-20 DIAGNOSIS — M5416 Radiculopathy, lumbar region: Secondary | ICD-10-CM | POA: Diagnosis not present

## 2022-11-20 DIAGNOSIS — M25551 Pain in right hip: Secondary | ICD-10-CM | POA: Diagnosis not present

## 2022-11-20 DIAGNOSIS — M791 Myalgia, unspecified site: Secondary | ICD-10-CM | POA: Diagnosis not present

## 2022-11-20 DIAGNOSIS — M47896 Other spondylosis, lumbar region: Secondary | ICD-10-CM | POA: Diagnosis not present

## 2022-12-10 ENCOUNTER — Other Ambulatory Visit: Payer: Self-pay

## 2022-12-10 ENCOUNTER — Other Ambulatory Visit (HOSPITAL_COMMUNITY): Payer: Self-pay

## 2022-12-11 ENCOUNTER — Other Ambulatory Visit: Payer: Self-pay

## 2022-12-11 ENCOUNTER — Other Ambulatory Visit (HOSPITAL_COMMUNITY): Payer: Self-pay

## 2022-12-11 MED ORDER — NORETHINDRONE 0.35 MG PO TABS
1.0000 | ORAL_TABLET | Freq: Every day | ORAL | 0 refills | Status: AC
  Filled 2022-12-11: qty 84, 84d supply, fill #0

## 2022-12-12 DIAGNOSIS — M545 Low back pain, unspecified: Secondary | ICD-10-CM | POA: Diagnosis not present

## 2022-12-12 DIAGNOSIS — M5416 Radiculopathy, lumbar region: Secondary | ICD-10-CM | POA: Diagnosis not present

## 2022-12-17 DIAGNOSIS — Z87898 Personal history of other specified conditions: Secondary | ICD-10-CM | POA: Diagnosis not present

## 2022-12-17 DIAGNOSIS — H9190 Unspecified hearing loss, unspecified ear: Secondary | ICD-10-CM | POA: Diagnosis not present

## 2022-12-17 DIAGNOSIS — J342 Deviated nasal septum: Secondary | ICD-10-CM | POA: Diagnosis not present

## 2022-12-26 ENCOUNTER — Other Ambulatory Visit: Payer: Self-pay

## 2022-12-26 ENCOUNTER — Other Ambulatory Visit (HOSPITAL_COMMUNITY): Payer: Self-pay

## 2022-12-26 MED ORDER — ALBUTEROL SULFATE HFA 108 (90 BASE) MCG/ACT IN AERS
2.0000 | INHALATION_SPRAY | Freq: Four times a day (QID) | RESPIRATORY_TRACT | 11 refills | Status: DC | PRN
  Filled 2022-12-26: qty 6.7, 25d supply, fill #0
  Filled 2023-05-13: qty 6.7, 25d supply, fill #1
  Filled 2023-10-17: qty 6.7, 25d supply, fill #2

## 2023-01-07 ENCOUNTER — Other Ambulatory Visit (HOSPITAL_COMMUNITY): Payer: Self-pay

## 2023-01-10 ENCOUNTER — Other Ambulatory Visit (HOSPITAL_COMMUNITY): Payer: Self-pay

## 2023-01-10 MED ORDER — UBRELVY 100 MG PO TABS
ORAL_TABLET | ORAL | 5 refills | Status: AC
  Filled 2023-01-10 – 2023-02-20 (×2): qty 16, 30d supply, fill #0
  Filled 2023-07-31: qty 16, 30d supply, fill #1

## 2023-01-15 ENCOUNTER — Other Ambulatory Visit (HOSPITAL_COMMUNITY): Payer: Self-pay

## 2023-01-24 ENCOUNTER — Other Ambulatory Visit (HOSPITAL_COMMUNITY): Payer: Self-pay

## 2023-02-11 DIAGNOSIS — Z30430 Encounter for insertion of intrauterine contraceptive device: Secondary | ICD-10-CM | POA: Diagnosis not present

## 2023-02-14 ENCOUNTER — Other Ambulatory Visit: Payer: Self-pay

## 2023-02-14 ENCOUNTER — Other Ambulatory Visit (HOSPITAL_COMMUNITY): Payer: Self-pay

## 2023-02-14 MED ORDER — UBRELVY 100 MG PO TABS
ORAL_TABLET | ORAL | 5 refills | Status: AC
  Filled 2023-02-14: qty 16, 8d supply, fill #0
  Filled 2023-05-13: qty 16, 30d supply, fill #0
  Filled 2023-07-29 – 2023-08-14 (×3): qty 16, 30d supply, fill #1
  Filled 2023-10-17: qty 16, 30d supply, fill #2
  Filled 2023-12-22: qty 16, 30d supply, fill #3

## 2023-02-20 ENCOUNTER — Other Ambulatory Visit (HOSPITAL_COMMUNITY): Payer: Self-pay

## 2023-02-20 DIAGNOSIS — G43909 Migraine, unspecified, not intractable, without status migrainosus: Secondary | ICD-10-CM | POA: Diagnosis not present

## 2023-02-25 ENCOUNTER — Other Ambulatory Visit: Payer: Self-pay

## 2023-02-25 DIAGNOSIS — L814 Other melanin hyperpigmentation: Secondary | ICD-10-CM | POA: Diagnosis not present

## 2023-02-25 DIAGNOSIS — D2261 Melanocytic nevi of right upper limb, including shoulder: Secondary | ICD-10-CM | POA: Diagnosis not present

## 2023-02-25 DIAGNOSIS — D2271 Melanocytic nevi of right lower limb, including hip: Secondary | ICD-10-CM | POA: Diagnosis not present

## 2023-02-25 DIAGNOSIS — D2272 Melanocytic nevi of left lower limb, including hip: Secondary | ICD-10-CM | POA: Diagnosis not present

## 2023-02-25 DIAGNOSIS — D224 Melanocytic nevi of scalp and neck: Secondary | ICD-10-CM | POA: Diagnosis not present

## 2023-02-25 DIAGNOSIS — D225 Melanocytic nevi of trunk: Secondary | ICD-10-CM | POA: Diagnosis not present

## 2023-02-25 DIAGNOSIS — L905 Scar conditions and fibrosis of skin: Secondary | ICD-10-CM | POA: Diagnosis not present

## 2023-02-25 DIAGNOSIS — D2262 Melanocytic nevi of left upper limb, including shoulder: Secondary | ICD-10-CM | POA: Diagnosis not present

## 2023-02-25 DIAGNOSIS — L718 Other rosacea: Secondary | ICD-10-CM | POA: Diagnosis not present

## 2023-02-25 DIAGNOSIS — L821 Other seborrheic keratosis: Secondary | ICD-10-CM | POA: Diagnosis not present

## 2023-02-25 MED ORDER — TRETINOIN 0.025 % EX CREA
1.0000 "application " | TOPICAL_CREAM | Freq: Every evening | CUTANEOUS | 3 refills | Status: AC
  Filled 2023-02-25 – 2023-02-26 (×2): qty 45, 30d supply, fill #0

## 2023-02-25 MED ORDER — METRONIDAZOLE 0.75 % EX CREA
1.0000 | TOPICAL_CREAM | Freq: Two times a day (BID) | CUTANEOUS | 5 refills | Status: AC
  Filled 2023-02-25 – 2023-02-26 (×2): qty 45, 23d supply, fill #0

## 2023-02-26 ENCOUNTER — Other Ambulatory Visit: Payer: Self-pay

## 2023-02-26 ENCOUNTER — Other Ambulatory Visit (HOSPITAL_COMMUNITY): Payer: Self-pay

## 2023-03-19 ENCOUNTER — Other Ambulatory Visit (HOSPITAL_COMMUNITY): Payer: Self-pay

## 2023-03-20 ENCOUNTER — Other Ambulatory Visit (HOSPITAL_COMMUNITY): Payer: Self-pay

## 2023-03-21 ENCOUNTER — Other Ambulatory Visit (HOSPITAL_COMMUNITY): Payer: Self-pay

## 2023-04-02 DIAGNOSIS — Z1231 Encounter for screening mammogram for malignant neoplasm of breast: Secondary | ICD-10-CM | POA: Diagnosis not present

## 2023-04-02 DIAGNOSIS — L292 Pruritus vulvae: Secondary | ICD-10-CM | POA: Diagnosis not present

## 2023-04-02 DIAGNOSIS — Z01419 Encounter for gynecological examination (general) (routine) without abnormal findings: Secondary | ICD-10-CM | POA: Diagnosis not present

## 2023-04-02 DIAGNOSIS — N393 Stress incontinence (female) (male): Secondary | ICD-10-CM | POA: Diagnosis not present

## 2023-04-08 ENCOUNTER — Other Ambulatory Visit (HOSPITAL_COMMUNITY): Payer: Self-pay

## 2023-04-08 MED ORDER — PREDNISONE 10 MG (21) PO TBPK
ORAL_TABLET | ORAL | 0 refills | Status: AC
  Filled 2023-04-08: qty 21, 6d supply, fill #0

## 2023-04-22 ENCOUNTER — Other Ambulatory Visit: Payer: Self-pay

## 2023-04-22 ENCOUNTER — Other Ambulatory Visit (HOSPITAL_COMMUNITY): Payer: Self-pay

## 2023-04-22 DIAGNOSIS — Z7189 Other specified counseling: Secondary | ICD-10-CM | POA: Diagnosis not present

## 2023-04-22 DIAGNOSIS — R002 Palpitations: Secondary | ICD-10-CM | POA: Diagnosis not present

## 2023-04-22 DIAGNOSIS — R000 Tachycardia, unspecified: Secondary | ICD-10-CM | POA: Diagnosis not present

## 2023-04-22 MED ORDER — DILTIAZEM HCL 30 MG PO TABS
30.0000 mg | ORAL_TABLET | ORAL | 3 refills | Status: AC | PRN
  Filled 2023-04-22: qty 100, 17d supply, fill #0

## 2023-04-24 ENCOUNTER — Other Ambulatory Visit: Payer: Self-pay | Admitting: Family

## 2023-04-24 ENCOUNTER — Other Ambulatory Visit (HOSPITAL_COMMUNITY): Payer: Self-pay

## 2023-04-24 ENCOUNTER — Other Ambulatory Visit: Payer: Self-pay

## 2023-04-24 DIAGNOSIS — J4540 Moderate persistent asthma, uncomplicated: Secondary | ICD-10-CM | POA: Diagnosis not present

## 2023-04-24 DIAGNOSIS — G501 Atypical facial pain: Secondary | ICD-10-CM

## 2023-04-24 MED ORDER — FLUTICASONE FUROATE-VILANTEROL 100-25 MCG/ACT IN AEPB
1.0000 | INHALATION_SPRAY | Freq: Every day | RESPIRATORY_TRACT | 4 refills | Status: DC
Start: 2023-04-24 — End: 2023-05-09
  Filled 2023-04-24: qty 60, 30d supply, fill #0
  Filled 2023-05-02: qty 180, 90d supply, fill #0

## 2023-04-25 ENCOUNTER — Other Ambulatory Visit: Payer: Self-pay

## 2023-05-02 ENCOUNTER — Other Ambulatory Visit (HOSPITAL_COMMUNITY): Payer: Self-pay

## 2023-05-13 ENCOUNTER — Other Ambulatory Visit: Payer: Self-pay

## 2023-05-14 ENCOUNTER — Other Ambulatory Visit (HOSPITAL_COMMUNITY): Payer: Self-pay

## 2023-07-02 ENCOUNTER — Ambulatory Visit: Admission: RE | Admit: 2023-07-02 | Source: Ambulatory Visit

## 2023-07-02 DIAGNOSIS — G43909 Migraine, unspecified, not intractable, without status migrainosus: Secondary | ICD-10-CM | POA: Diagnosis not present

## 2023-07-02 DIAGNOSIS — G501 Atypical facial pain: Secondary | ICD-10-CM | POA: Diagnosis not present

## 2023-07-02 DIAGNOSIS — R519 Headache, unspecified: Secondary | ICD-10-CM | POA: Diagnosis not present

## 2023-07-30 ENCOUNTER — Other Ambulatory Visit (HOSPITAL_COMMUNITY): Payer: Self-pay

## 2023-07-31 ENCOUNTER — Other Ambulatory Visit (HOSPITAL_COMMUNITY): Payer: Self-pay

## 2023-08-12 ENCOUNTER — Other Ambulatory Visit: Payer: Self-pay | Admitting: Nurse Practitioner

## 2023-08-12 DIAGNOSIS — Z1231 Encounter for screening mammogram for malignant neoplasm of breast: Secondary | ICD-10-CM

## 2023-08-14 ENCOUNTER — Other Ambulatory Visit (HOSPITAL_COMMUNITY): Payer: Self-pay

## 2023-09-19 DIAGNOSIS — E559 Vitamin D deficiency, unspecified: Secondary | ICD-10-CM | POA: Diagnosis not present

## 2023-09-19 DIAGNOSIS — Z711 Person with feared health complaint in whom no diagnosis is made: Secondary | ICD-10-CM | POA: Diagnosis not present

## 2023-09-19 DIAGNOSIS — M5481 Occipital neuralgia: Secondary | ICD-10-CM | POA: Diagnosis not present

## 2023-09-19 DIAGNOSIS — R002 Palpitations: Secondary | ICD-10-CM | POA: Diagnosis not present

## 2023-09-23 DIAGNOSIS — R000 Tachycardia, unspecified: Secondary | ICD-10-CM | POA: Diagnosis not present

## 2023-09-23 DIAGNOSIS — Z7189 Other specified counseling: Secondary | ICD-10-CM | POA: Diagnosis not present

## 2023-09-23 DIAGNOSIS — Z8249 Family history of ischemic heart disease and other diseases of the circulatory system: Secondary | ICD-10-CM | POA: Diagnosis not present

## 2023-09-23 DIAGNOSIS — R002 Palpitations: Secondary | ICD-10-CM | POA: Diagnosis not present

## 2023-09-25 ENCOUNTER — Other Ambulatory Visit (HOSPITAL_COMMUNITY): Payer: Self-pay

## 2023-09-26 ENCOUNTER — Other Ambulatory Visit: Payer: Self-pay

## 2023-09-26 ENCOUNTER — Other Ambulatory Visit (HOSPITAL_COMMUNITY): Payer: Self-pay

## 2023-09-26 MED ORDER — TRELEGY ELLIPTA 100-62.5-25 MCG/ACT IN AEPB
1.0000 | INHALATION_SPRAY | Freq: Every day | RESPIRATORY_TRACT | 0 refills | Status: DC
  Filled 2023-09-26: qty 180, 90d supply, fill #0

## 2023-10-11 DIAGNOSIS — R002 Palpitations: Secondary | ICD-10-CM | POA: Diagnosis not present

## 2023-10-17 ENCOUNTER — Other Ambulatory Visit (HOSPITAL_COMMUNITY): Payer: Self-pay

## 2023-10-24 DIAGNOSIS — J4550 Severe persistent asthma, uncomplicated: Secondary | ICD-10-CM | POA: Diagnosis not present

## 2023-10-24 DIAGNOSIS — J309 Allergic rhinitis, unspecified: Secondary | ICD-10-CM | POA: Diagnosis not present

## 2023-10-24 DIAGNOSIS — J4540 Moderate persistent asthma, uncomplicated: Secondary | ICD-10-CM | POA: Diagnosis not present

## 2023-10-25 ENCOUNTER — Other Ambulatory Visit (HOSPITAL_COMMUNITY): Payer: Self-pay

## 2023-10-25 MED ORDER — TRELEGY ELLIPTA 100-62.5-25 MCG/ACT IN AEPB: 1.0000 | INHALATION_SPRAY | Freq: Every day | RESPIRATORY_TRACT | 4 refills | Status: AC

## 2023-10-25 MED ORDER — ALBUTEROL SULFATE HFA 108 (90 BASE) MCG/ACT IN AERS: 2.0000 | INHALATION_SPRAY | Freq: Four times a day (QID) | RESPIRATORY_TRACT | 11 refills | Status: AC | PRN

## 2023-10-30 ENCOUNTER — Ambulatory Visit
Admission: RE | Admit: 2023-10-30 | Discharge: 2023-10-30 | Disposition: A | Discharge status: Home / Self Care | Source: Ambulatory Visit | Attending: Nurse Practitioner | Admitting: Nurse Practitioner

## 2023-10-30 DIAGNOSIS — Z1231 Encounter for screening mammogram for malignant neoplasm of breast: Secondary | ICD-10-CM | POA: Diagnosis not present

## 2023-11-03 ENCOUNTER — Other Ambulatory Visit: Payer: Self-pay | Admitting: Medical Genetics

## 2023-11-03 DIAGNOSIS — Z006 Encounter for examination for normal comparison and control in clinical research program: Secondary | ICD-10-CM

## 2023-11-06 DIAGNOSIS — Z23 Encounter for immunization: Secondary | ICD-10-CM | POA: Diagnosis not present

## 2023-11-06 DIAGNOSIS — Z1331 Encounter for screening for depression: Secondary | ICD-10-CM | POA: Diagnosis not present

## 2023-11-06 DIAGNOSIS — Z1330 Encounter for screening examination for mental health and behavioral disorders, unspecified: Secondary | ICD-10-CM | POA: Diagnosis not present

## 2023-11-06 DIAGNOSIS — J4520 Mild intermittent asthma, uncomplicated: Secondary | ICD-10-CM | POA: Diagnosis not present

## 2023-11-06 DIAGNOSIS — Z0000 Encounter for general adult medical examination without abnormal findings: Secondary | ICD-10-CM | POA: Diagnosis not present

## 2023-11-06 DIAGNOSIS — R5383 Other fatigue: Secondary | ICD-10-CM | POA: Diagnosis not present

## 2023-11-06 DIAGNOSIS — Z13220 Encounter for screening for lipoid disorders: Secondary | ICD-10-CM | POA: Diagnosis not present

## 2023-11-08 ENCOUNTER — Other Ambulatory Visit: Payer: Self-pay

## 2023-11-08 ENCOUNTER — Other Ambulatory Visit (HOSPITAL_COMMUNITY): Payer: Self-pay

## 2023-11-08 ENCOUNTER — Encounter (HOSPITAL_COMMUNITY): Payer: Self-pay

## 2023-11-08 MED ORDER — TEZEPELUMAB-EKKO 210 MG/1.91ML ~~LOC~~ SOSY: 210.0000 mg | PREFILLED_SYRINGE | SUBCUTANEOUS | 4 refills | Status: DC

## 2023-11-08 NOTE — Progress Notes (Signed)
 Patient to be enrolled with [facility]. Routed to Tiffany.

## 2023-11-11 ENCOUNTER — Other Ambulatory Visit: Payer: Self-pay | Admitting: Nurse Practitioner

## 2023-11-11 ENCOUNTER — Ambulatory Visit: Attending: Family Medicine | Admitting: Pharmacist

## 2023-11-11 ENCOUNTER — Encounter: Payer: Self-pay | Admitting: Pharmacist

## 2023-11-11 ENCOUNTER — Other Ambulatory Visit: Payer: Self-pay

## 2023-11-11 ENCOUNTER — Other Ambulatory Visit: Payer: Self-pay | Admitting: Pharmacist

## 2023-11-11 ENCOUNTER — Encounter (HOSPITAL_COMMUNITY): Payer: Self-pay

## 2023-11-11 DIAGNOSIS — R928 Other abnormal and inconclusive findings on diagnostic imaging of breast: Secondary | ICD-10-CM

## 2023-11-11 DIAGNOSIS — Z7189 Other specified counseling: Secondary | ICD-10-CM

## 2023-11-11 DIAGNOSIS — J45909 Unspecified asthma, uncomplicated: Secondary | ICD-10-CM

## 2023-11-11 MED ORDER — TEZEPELUMAB-EKKO 210 MG/1.91ML ~~LOC~~ SOSY
210.0000 mg | PREFILLED_SYRINGE | SUBCUTANEOUS | 4 refills | Status: AC
  Filled 2023-11-11: qty 1.91, 28d supply, fill #0
  Filled 2023-11-11: qty 5.73, 84d supply, fill #0
  Filled 2023-12-03: qty 1.91, 28d supply, fill #1

## 2023-11-11 NOTE — Progress Notes (Signed)
 Specialty Pharmacy Initial Fill Coordination Note  Rachel Burch is a 48 y.o. female contacted today regarding initial fill of specialty medication(s) Tezepelumab-ekko PHILL)   Patient requested Delivery   Delivery date: 11/13/23   Verified address: [address]   Medication will be filled on: 11/12/23   Patient is aware of $0 copayment.

## 2023-11-11 NOTE — Progress Notes (Signed)
 See OV from 11/11/23 for complete documentation.   Rachel Burch, PharmD, JAQUELINE, CPP Clinical Pharmacist [facility] [phone]

## 2023-11-11 NOTE — Progress Notes (Signed)
 HPI Patient presents today to [facility] clinic for Tezspire new start. She is taking this under the care of Dr. Vernette for asthma.   Adherence: has not yet started   Efficacy: has not yet started   OBJECTIVE Allergies  Allergen Reactions   Iodinated Contrast Media Hives    8/1-pt was pre-medicated and had a singular break through hive, pt was given an additional benadryl    Outpatient Encounter Medications as of 11/11/2023  Medication Sig   albuterol  (VENTOLIN  HFA) 108 (90 Base) MCG/ACT inhaler Inhale 2 puffs by mouth into the lungs every 6 (six) hours as needed for wheezing   albuterol  (VENTOLIN  HFA) 108 (90 Base) MCG/ACT inhaler Inhale 2 puffs into the lungs every 6 (six) hours as needed for wheezing.   diltiazem  (CARDIZEM ) 30 MG tablet Take 1 tablet (30 mg total) by mouth every 4 (four) hours as needed for tachycardia or palpitations.   Fluticasone -Umeclidin-Vilant (TRELEGY ELLIPTA ) 100-62.5-25 MCG/ACT AEPB Inhale 1 puff into the lungs daily.   gabapentin (NEURONTIN) 100 MG capsule gabapentin 100 mg capsule   levonorgestrel  (MIRENA ) 20 MCG/24HR IUD by Intrauterine route.   meloxicam  (MOBIC ) 15 MG tablet TAKE 1 TABLET BY MOUTH DAILY   meloxicam  (MOBIC ) 15 MG tablet Take 1 tablet (15 mg total) by mouth daily.   methylPREDNISolone  (MEDROL  DOSEPAK) 4 MG TBPK tablet Take as directed   metroNIDAZOLE  (METROCREAM ) 0.75 % cream Apply a thin layer topically to face 2 (two) times daily.   norethindrone  (MICRONOR ) 0.35 MG tablet Take 1 tablet (0.35 mg total) by mouth daily.   pantoprazole  (PROTONIX ) 40 MG tablet Take 1 tablet by mouth once daily   predniSONE  (STERAPRED UNI-PAK 21 TAB) 10 MG (21) TBPK tablet Take as directed   SUMAtriptan (IMITREX) 25 MG tablet    tezepelumab-ekko (TEZSPIRE) 210 MG/1. syringe Inject 1.91 mLs (210 mg total) into the skin every 28 (twenty-eight) days.   tretinoin  (RETIN-A ) 0.025 % cream Apply a pea sized amount to the face once nightly, as tolerated.    Ubrogepant  (UBRELVY ) 100 MG TABS Take 1 tablet at migraine onset. May repeat 1 dose in 2 hours. Max 2 tabs in 24 hours.   Ubrogepant  (UBRELVY ) 100 MG TABS Take 1 tablet by mouth at migraine onset. May repeat x1 dose in 2 hours. Max 2 doses/24 hours   [DISCONTINUED] fluticasone -salmeterol (ADVAIR  HFA) 45-21 MCG/ACT inhaler Inhale 2 inhalations into the lungs every 12 (twelve) hours   No facility-administered encounter medications on file as of 11/11/2023.     Immunization History  Administered Date(s) Administered   H1N1 10/13/2022   Influenza,inj,Quad PF,6+ Mos 10/07/2018   Influenza-Unspecified 10/22/2014, 10/08/2016, 09/19/2017, 10/08/2019, 10/12/2023   Moderna Sars-Covid-2 Vaccination 02/06/2019, 03/06/2019   PFIZER(Purple Top)SARS-COV-2 Vaccination 12/25/2019   PNEUMOCOCCAL CONJUGATE-20 10/19/2021   Pneumococcal Polysaccharide-23 10/21/2019   Tdap 03/20/2017     PFTs     No data to display          Assessment   Biologics training for tezepulumab Phill)  Goals of therapy: Mechanism: human monoclonal IgG2? antibody that binds to TSLP. This blocks TSLP from its effect on inflammation including reduce eosinophils, IgE, FeNO, IL-5, and IL-13. Mechanism is not definitively established. Reviewed that Tezspire is add-on medication and patient must continue maintenance inhaler regimen. Response to therapy: may take 3-4 months to determine efficacy.  Side effects: injection site reaction (6-18%), antibody development (2%), arthralgia (4%), back pain (4%), pharyngitis (4%)  Dose: Tezspire 210 mg once every 4 weeks  Administration/Storage:  Reviewed administration  sites of thigh or abdomen (at least 2-3 inches away from abdomen). Reviewed the upper arm is only appropriate if caregiver is administering injection  Do not shake pen/syringe as this could lead to product foaming or precipitation. Do not shake syringe as this could lead to product foaming or  precipitation.  Access: Approval of Tezspire through: insurance  Medication Reconciliation  A drug regimen assessment was performed, including review of allergies, interactions, disease-state management, dosing and immunization history. Medications were reviewed with the patient, including name, instructions, indication, goals of therapy, potential side effects, importance of adherence, and safe use.  Drug interaction(s): none identified  Immunizations  Patient is indicated for the influenzae, pneumonia vaccines. Patient has received COVID19 vaccines.  PLAN Continue Tezspire 210mg  SQ every 28 days.  Rx sent to: [facility]: [phone]   All questions encouraged and answered.  Instructed patient to reach out with any further questions or concerns.  Thank you for allowing pharmacy to participate in this patient's care.  This appointment required 30 minutes of patient care (this includes precharting, chart review, review of results, face-to-face care, etc.).  Herlene Fleeta Morris, PharmD, JAQUELINE, CPP Clinical Pharmacist [facility] [phone]

## 2023-11-11 NOTE — Progress Notes (Signed)
 Pharmacy Patient Advocate Encounter  Insurance verification completed.   The patient is insured through [employer] [facility] [employer]   Ran test claim for Tezspire. Co-pay is $0. Patient has copay card.  This test claim was processed through [facility]- copay amounts may vary at other pharmacies due to pharmacy/plan contracts, or as the patient moves through the different stages of their insurance plan.

## 2023-11-13 ENCOUNTER — Ambulatory Visit
Admission: RE | Admit: 2023-11-13 | Discharge: 2023-11-13 | Disposition: A | Discharge status: Home / Self Care | Source: Ambulatory Visit | Attending: Nurse Practitioner | Admitting: Nurse Practitioner

## 2023-11-13 DIAGNOSIS — R928 Other abnormal and inconclusive findings on diagnostic imaging of breast: Secondary | ICD-10-CM

## 2023-11-13 DIAGNOSIS — R92343 Mammographic extreme density, bilateral breasts: Secondary | ICD-10-CM | POA: Diagnosis not present

## 2023-12-03 ENCOUNTER — Other Ambulatory Visit: Payer: Self-pay

## 2023-12-06 ENCOUNTER — Other Ambulatory Visit: Payer: Self-pay

## 2023-12-10 ENCOUNTER — Other Ambulatory Visit: Payer: Self-pay

## 2023-12-23 ENCOUNTER — Other Ambulatory Visit: Payer: Self-pay

## 2023-12-23 ENCOUNTER — Other Ambulatory Visit (HOSPITAL_COMMUNITY): Payer: Self-pay

## 2023-12-24 DIAGNOSIS — Z13220 Encounter for screening for lipoid disorders: Secondary | ICD-10-CM | POA: Diagnosis not present

## 2023-12-24 DIAGNOSIS — R5383 Other fatigue: Secondary | ICD-10-CM | POA: Diagnosis not present
